# Patient Record
Sex: Male | Born: 1965 | Race: White | Hispanic: No | Marital: Married | Smoking: Former smoker
Health system: Southern US, Community
[De-identification: ages and names within clinical notes are randomized; demographics above are authoritative.]

## PROBLEM LIST (undated history)

## (undated) DIAGNOSIS — F419 Anxiety disorder, unspecified: Secondary | ICD-10-CM

## (undated) DIAGNOSIS — F329 Major depressive disorder, single episode, unspecified: Secondary | ICD-10-CM

## (undated) DIAGNOSIS — F32A Depression, unspecified: Secondary | ICD-10-CM

## (undated) DIAGNOSIS — G4730 Sleep apnea, unspecified: Secondary | ICD-10-CM

## (undated) HISTORY — PX: OTHER SURGICAL HISTORY: SHX169

## (undated) HISTORY — PX: VASECTOMY: SHX75

## (undated) HISTORY — PX: SHOULDER ARTHROSCOPY: SHX128

---

## 1997-05-30 ENCOUNTER — Encounter (HOSPITAL_COMMUNITY): Admission: RE | Admit: 1997-05-30 | Discharge: 1997-08-28 | Payer: Self-pay | Admitting: Psychiatry

## 1999-01-25 ENCOUNTER — Emergency Department (HOSPITAL_COMMUNITY): Admission: EM | Admit: 1999-01-25 | Discharge: 1999-01-25 | Payer: Self-pay | Admitting: Emergency Medicine

## 2000-08-30 ENCOUNTER — Ambulatory Visit (HOSPITAL_BASED_OUTPATIENT_CLINIC_OR_DEPARTMENT_OTHER): Admission: RE | Admit: 2000-08-30 | Discharge: 2000-08-30 | Payer: Self-pay | Admitting: Orthopedic Surgery

## 2003-01-05 ENCOUNTER — Emergency Department (HOSPITAL_COMMUNITY): Admission: EM | Admit: 2003-01-05 | Discharge: 2003-01-05 | Payer: Self-pay | Admitting: Emergency Medicine

## 2006-04-17 ENCOUNTER — Ambulatory Visit (HOSPITAL_COMMUNITY): Admission: RE | Admit: 2006-04-17 | Discharge: 2006-04-17 | Payer: Self-pay | Admitting: Family Medicine

## 2007-04-26 ENCOUNTER — Ambulatory Visit (HOSPITAL_COMMUNITY): Admission: RE | Admit: 2007-04-26 | Discharge: 2007-04-26 | Payer: Self-pay | Admitting: Family Medicine

## 2007-04-26 IMAGING — US US ABDOMEN COMPLETE
1 series · 14 of 25 positions shown · non-contrast
Comparison: none

CLINICAL DATA: 41-year-old male, elevated LFT(s), hepatomegaly by exam. 
ABDOMEN ULTRASOUND:
TECHNIQUE: Complete abdominal ultrasound examination was performed including evaluation of the liver, gallbladder, bile ducts, pancreas, kidneys, spleen, IVC, and abdominal aorta.

[Series 1: unknown · 0.33mm/px · 14 of 69 slices shown]
[im 1/69]
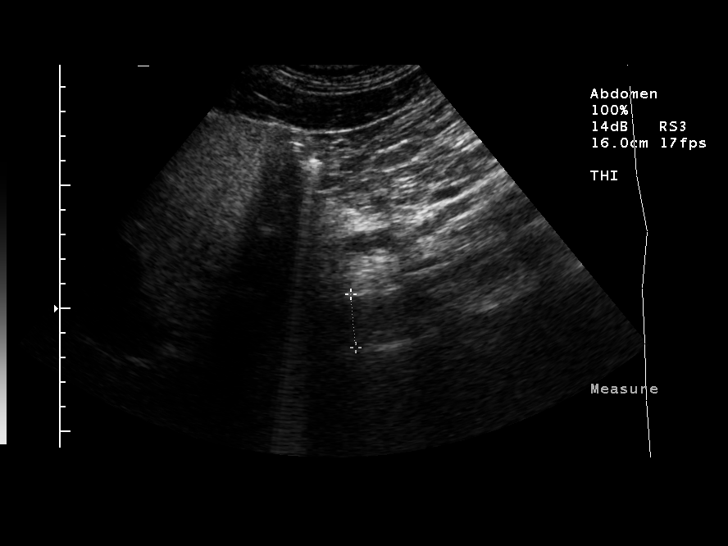
[im 6/69]
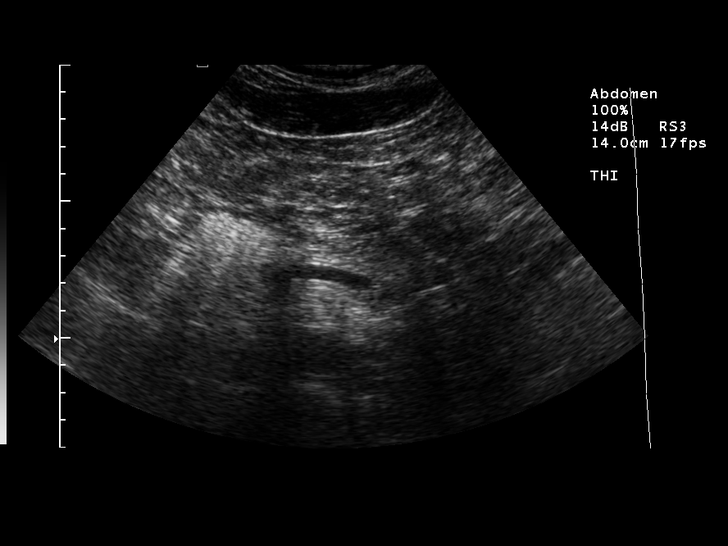
[im 12/69]
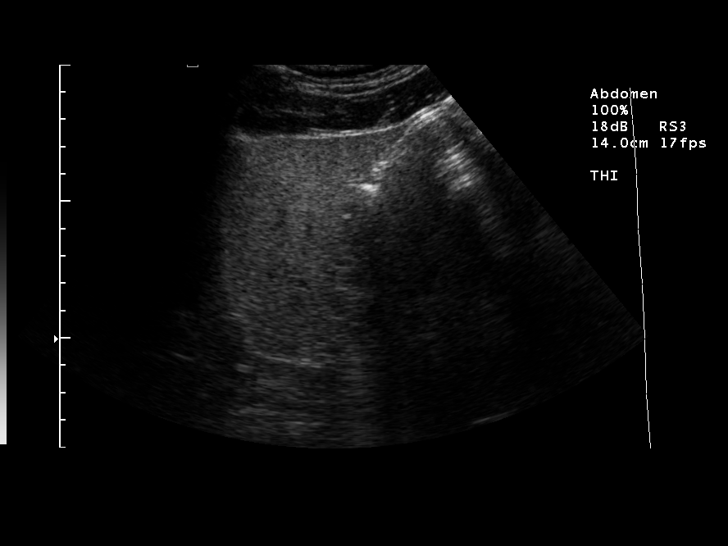
[im 18/69]
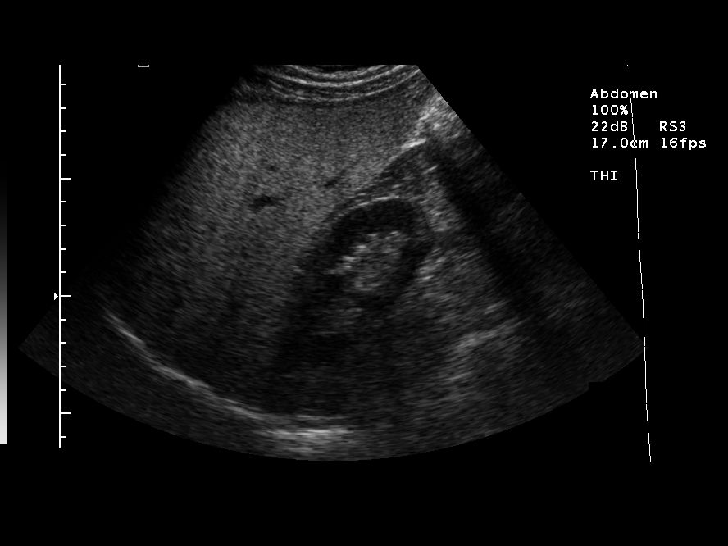
[im 23/69]
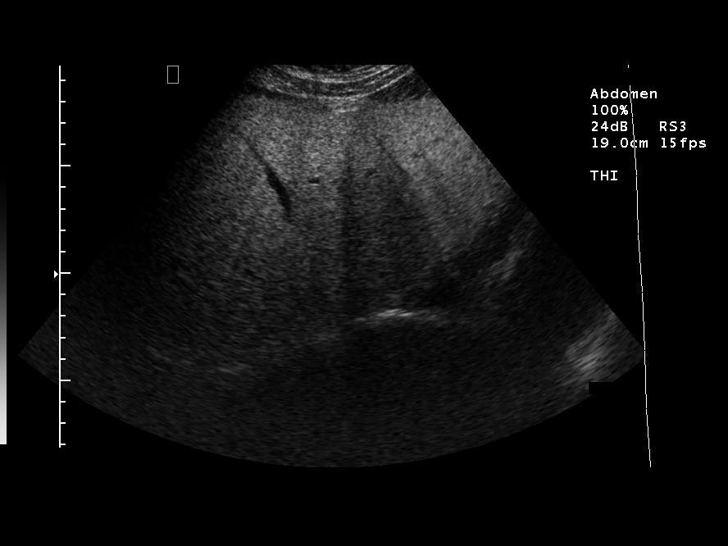
[im 26/69]
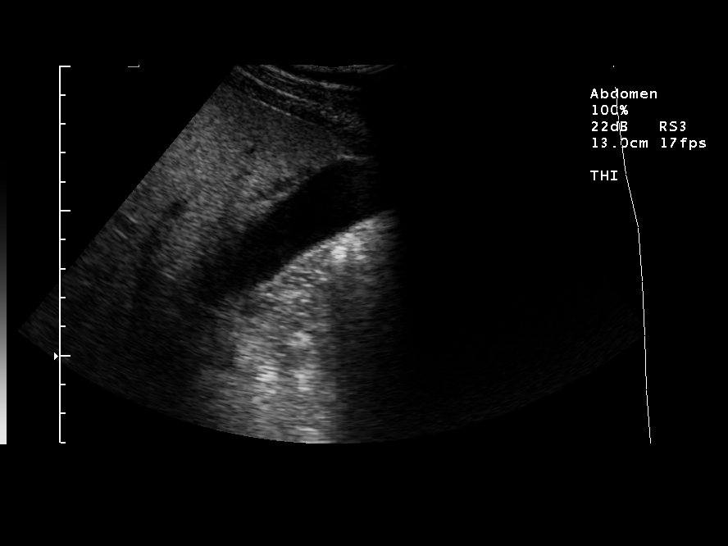
[im 32/69]
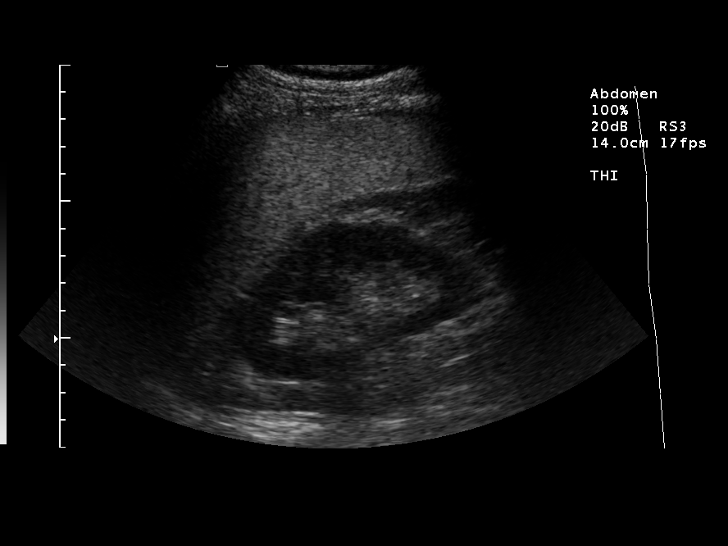
[im 37/69]
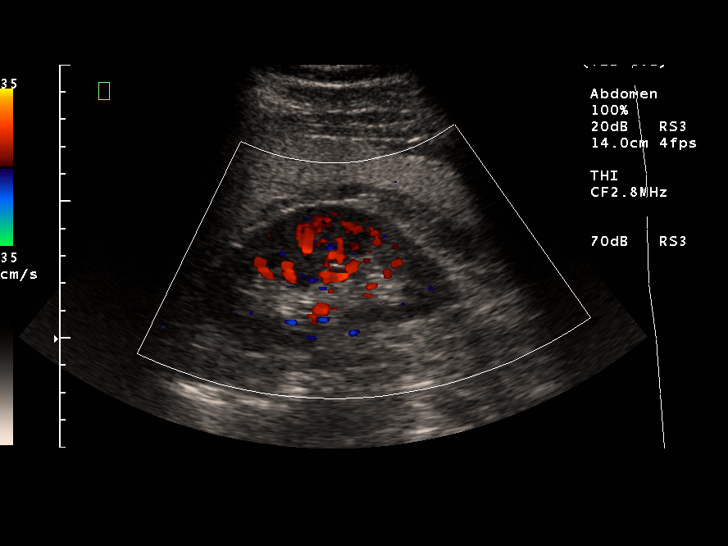
[im 43/69]
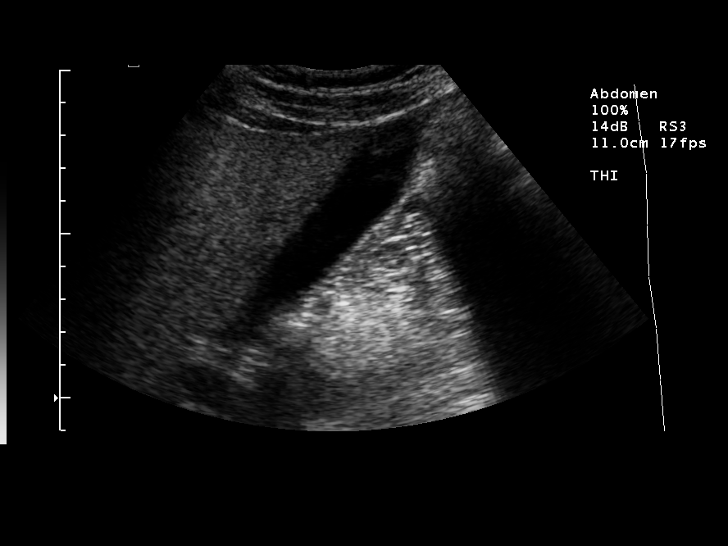
[im 46/69]
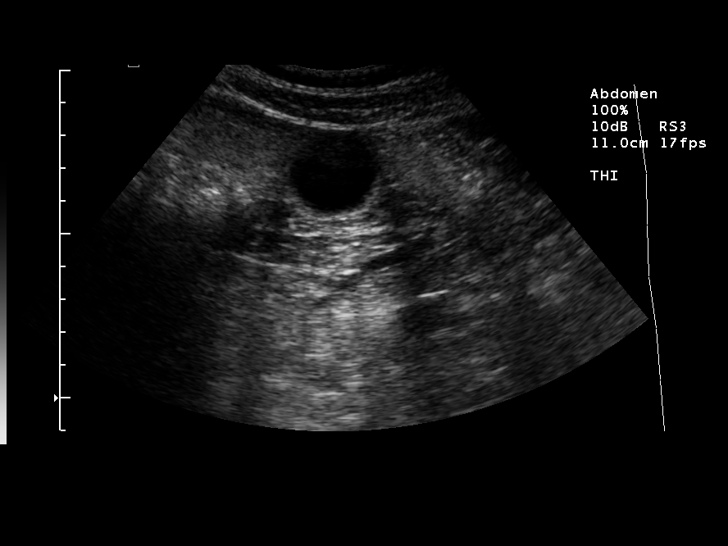
[im 52/69]
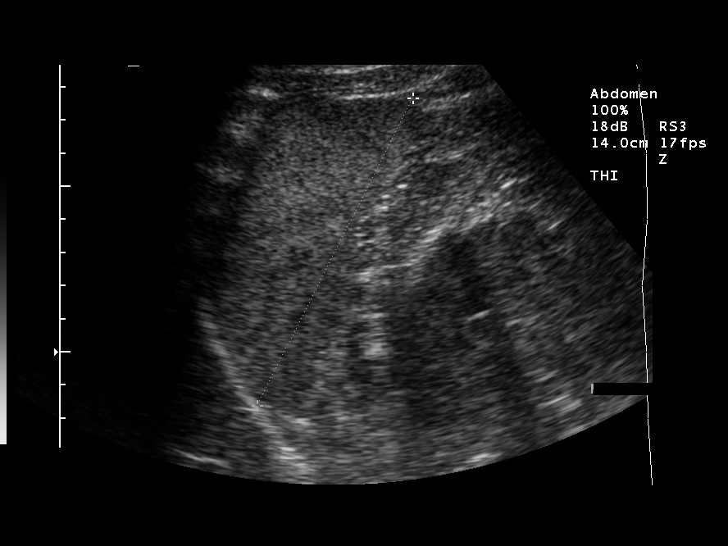
[im 57/69]
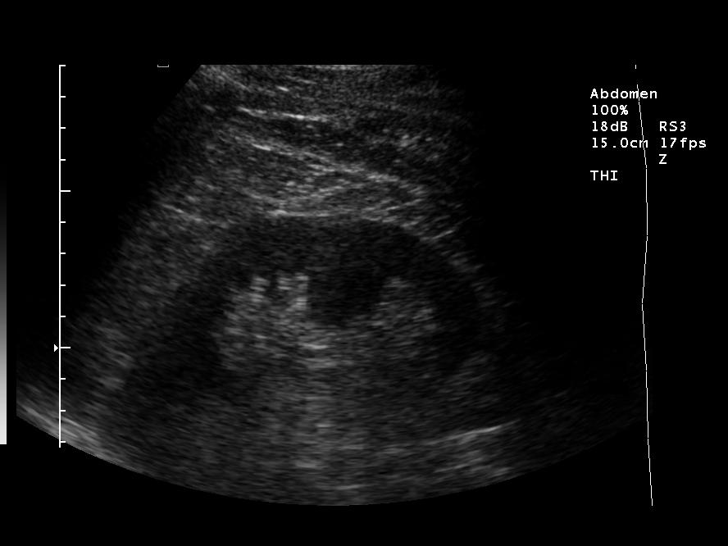
[im 63/69]
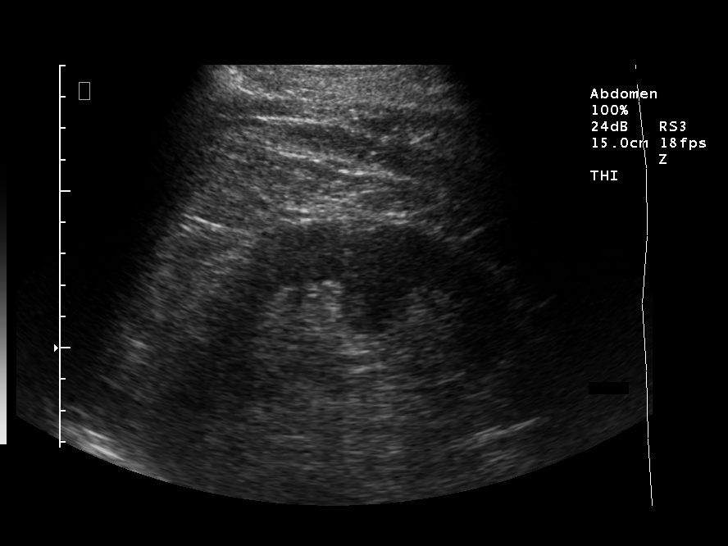
[im 69/69]
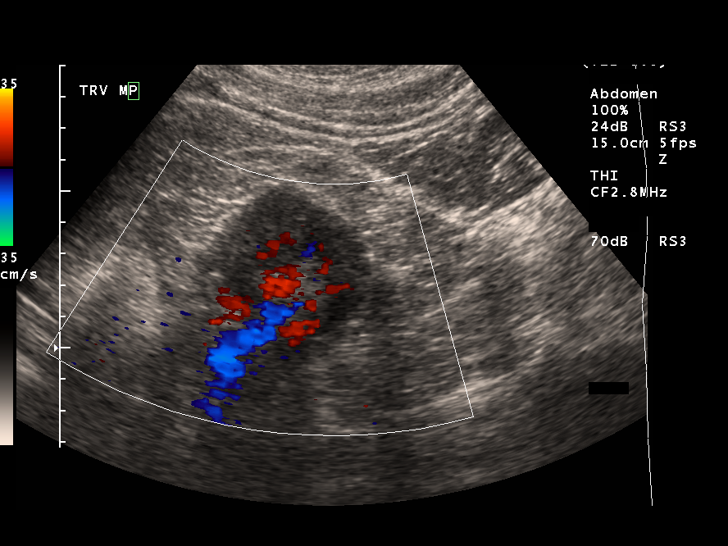

[14 of 25 positions shown; findings below may reference images not displayed]

FINDINGS: The gallbladder is normal with a wall thickness of 2 mm.  Negative for gallstones, pericholecystic fluid or a Murphy?s sign.  Common bile duct is normal measuring 4 mm.  The liver is diffusely echogenic consistent with fatty infiltration.  Longitudinal measurement of the liver is roughly 15.4 cm.  Imaging of the IVC, pancreas, spleen, kidneys and aorta is within normal limits.  The spleen measures 10.4 cm.  The right kidney measures 10.7 cm and the left kidney 11.3 cm.  No renal obstruction or hydronephrosis.  Aorta has a maximal diameter of 2.2 cm.  No aneurysm or ascites.
IMPRESSION: 1.  Increased echogenicity of the liver suspicious for fatty infiltration.  Liver craniocaudal length of 15.4 cm.
2.  No biliary dilatation.  
3.  No gallstones or gallbladder abnormality.

## 2007-05-03 ENCOUNTER — Ambulatory Visit: Payer: Self-pay | Admitting: Gastroenterology

## 2007-05-09 ENCOUNTER — Ambulatory Visit: Payer: Self-pay | Admitting: Gastroenterology

## 2007-07-05 ENCOUNTER — Ambulatory Visit: Payer: Self-pay | Admitting: Gastroenterology

## 2007-11-06 ENCOUNTER — Ambulatory Visit (HOSPITAL_BASED_OUTPATIENT_CLINIC_OR_DEPARTMENT_OTHER): Admission: RE | Admit: 2007-11-06 | Discharge: 2007-11-06 | Payer: Self-pay | Admitting: Orthopedic Surgery

## 2010-05-25 ENCOUNTER — Other Ambulatory Visit (HOSPITAL_COMMUNITY): Payer: Self-pay | Admitting: Family Medicine

## 2010-05-25 ENCOUNTER — Ambulatory Visit (HOSPITAL_COMMUNITY)
Admission: RE | Admit: 2010-05-25 | Discharge: 2010-05-25 | Disposition: A | Discharge status: Home / Self Care | Payer: BC Managed Care – PPO | Source: Ambulatory Visit | Attending: Family Medicine | Admitting: Family Medicine

## 2010-05-25 DIAGNOSIS — R109 Unspecified abdominal pain: Secondary | ICD-10-CM | POA: Insufficient documentation

## 2010-05-25 IMAGING — US US ABDOMEN COMPLETE
1 series · 13 of 25 positions shown · non-contrast
Comparison: 04/26/2007 study.

CLINICAL DATA: History of abdominal pain.

ABDOMINAL ULTRASOUND COMPLETE

[Series 1: us abdomen complete · 0.28mm/px · 13 of 99 slices shown]
[im 1/99]
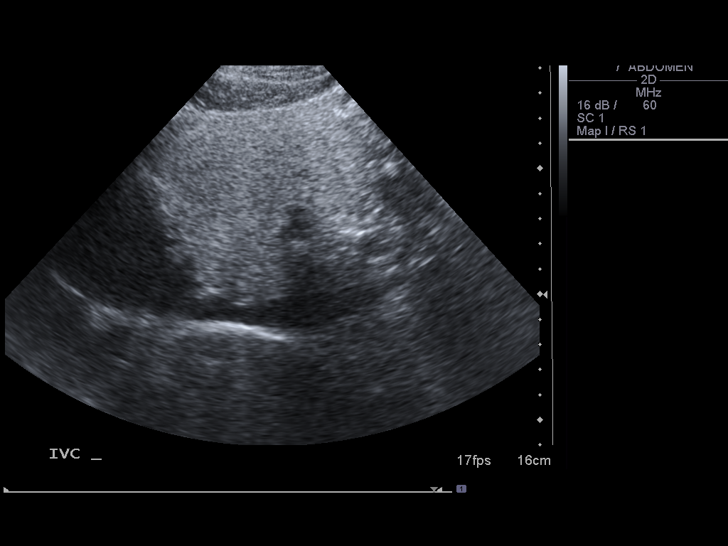
[im 9/99]
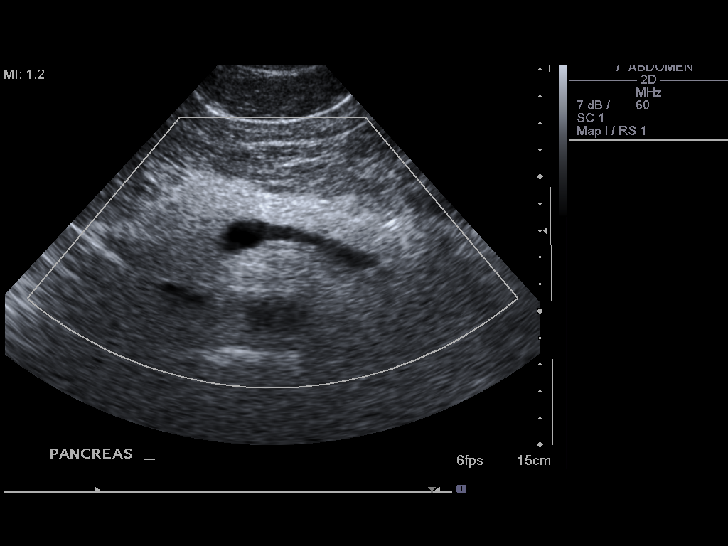
[im 17/99]
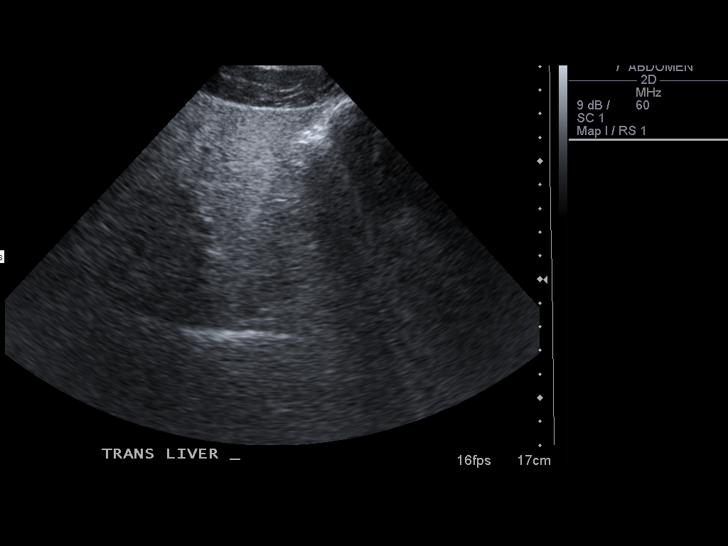
[im 25/99]
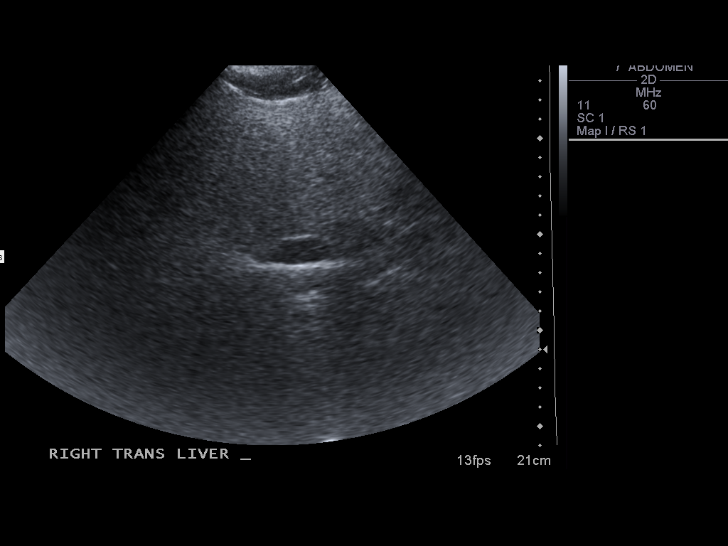
[im 33/99]
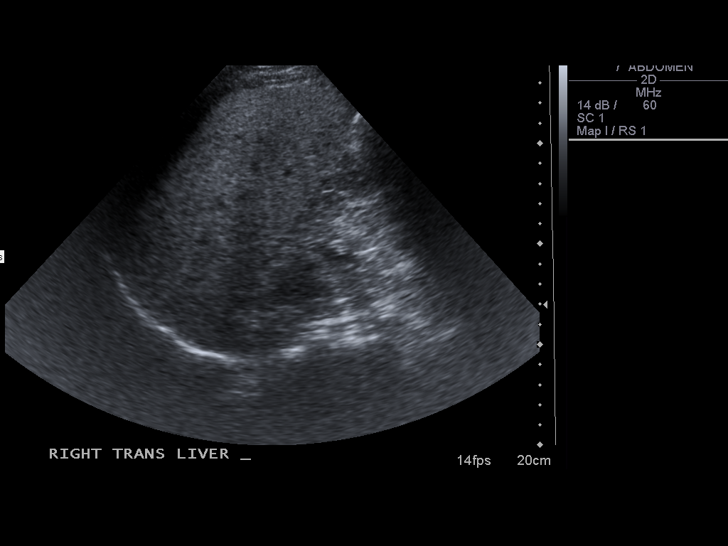
[im 41/99]
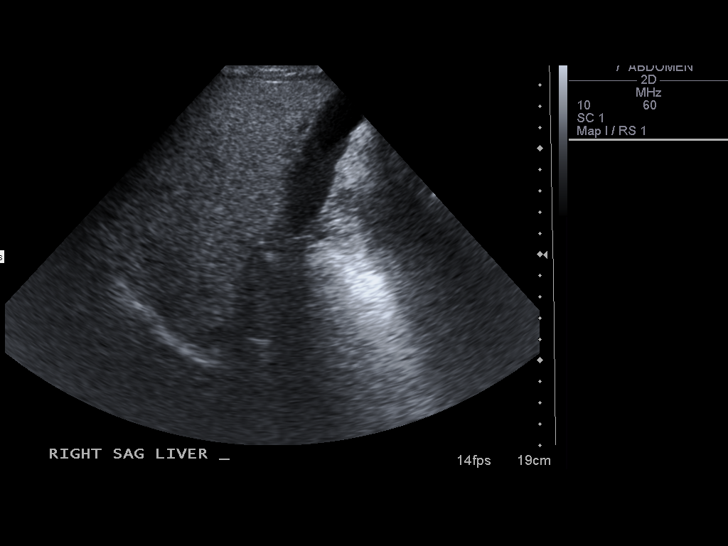
[im 50/99]
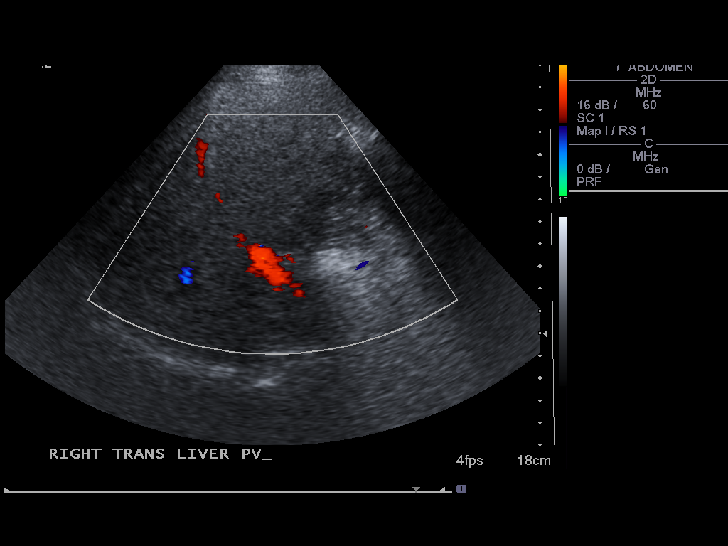
[im 58/99]
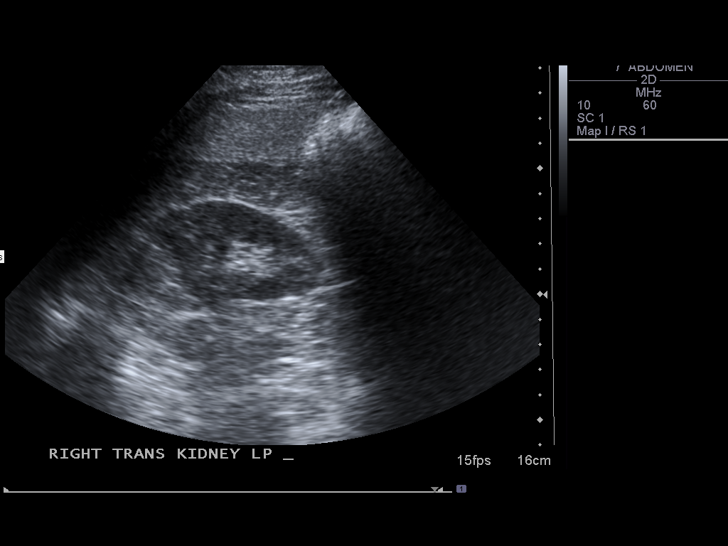
[im 66/99]
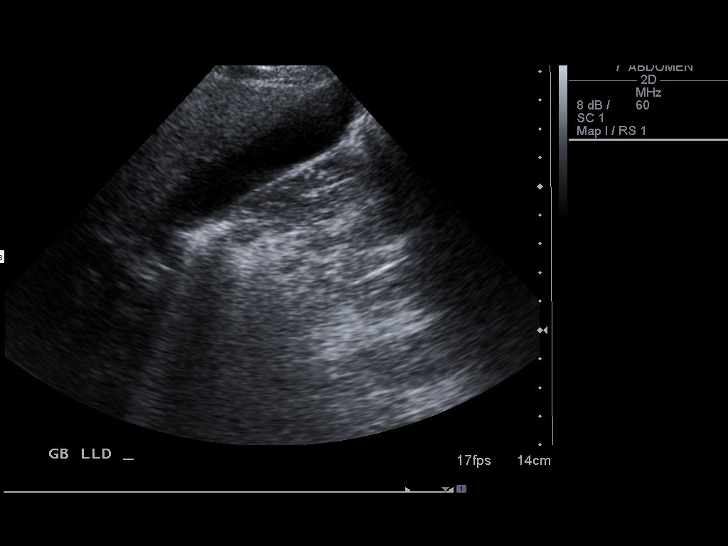
[im 74/99]
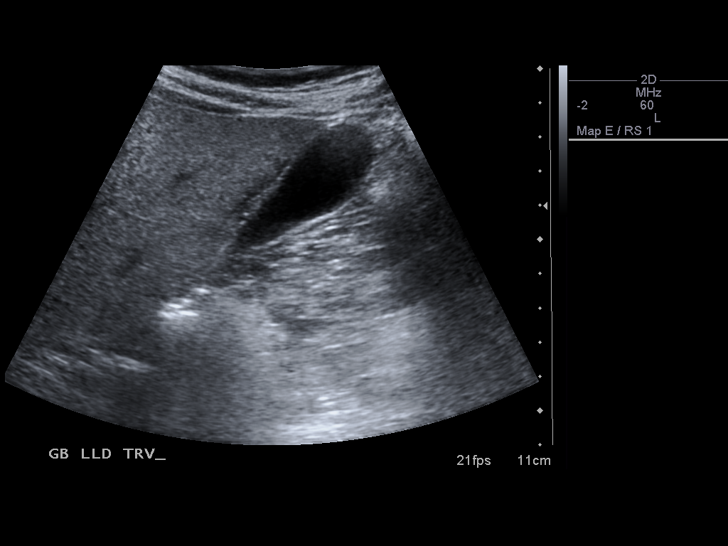
[im 82/99]
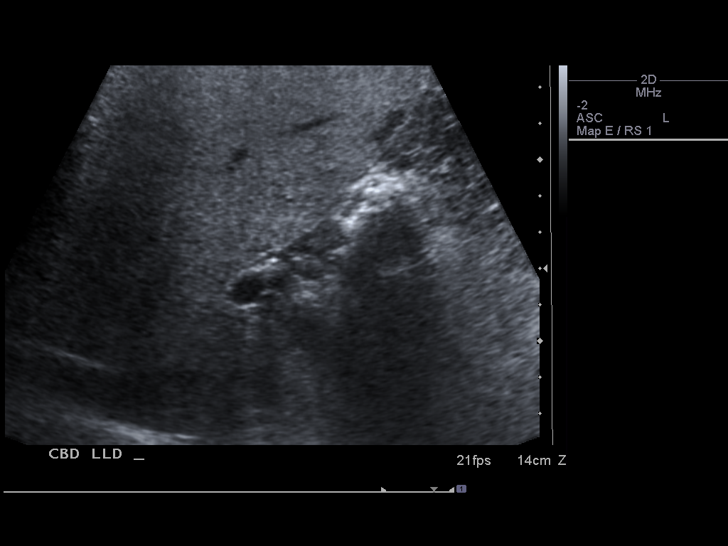
[im 90/99]
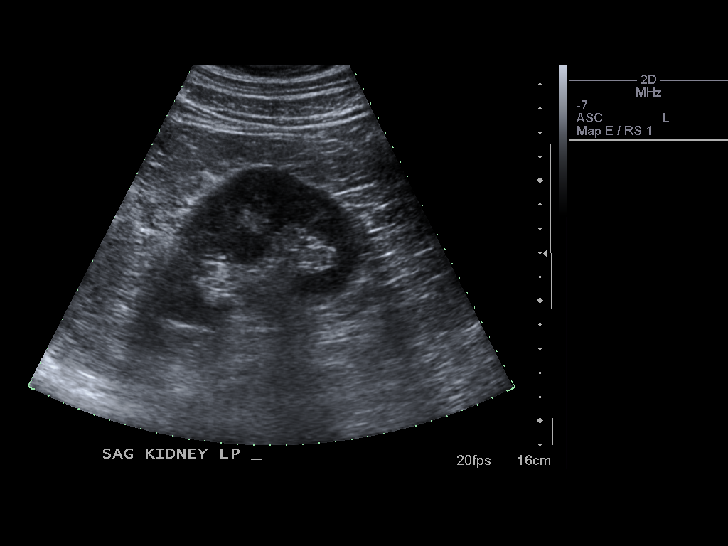
[im 99/99]
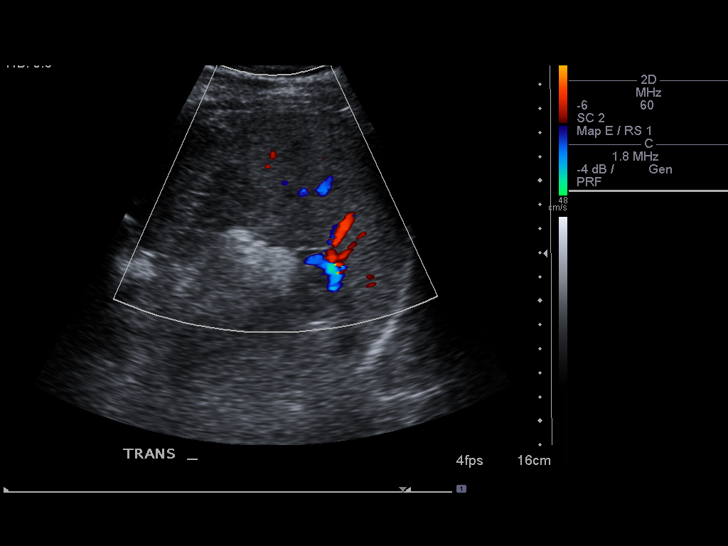

[13 of 25 positions shown; findings below may reference images not displayed]

FINDINGS: Gallbladder: No shadowing gallstones or echogenic sludge. No
gallbladder wall thickening or pericholecystic fluid. The
gallbladder wall thickness measured 0.7 mm. No sonographic Murphy's
sign according to the ultrasound technologist.

CBD: Normal in caliber measuring 1.6 mm. No choledocholithiasis is
evident.

Liver:  Normal size with increased echogenicity of parenchymal
echotexture without focal parenchymal abnormality. Most commonly
this is associated with fatty infiltration liver.  There appears to
be an area of fatty sparing near the gallbladder.

IVC:  Patent throughout its visualized course in the abdomen.

Pancreas:  Although the pancreas is difficult to visualize in its
entirety, no focal pancreatic abnormality is identified.

Spleen:  Normal size and echotexture without focal abnormality.
Length is 7.3 cm.

Right kidney:  No hydronephrosis.  Well-preserved cortex.  Normal
parenchymal echotexture without focal abnormalities.  Right renal
length is 9.6 cm.

Left kidney:  No hydronephrosis.  Well-preserved cortex.  Normal
parenchymal echotexture without focal abnormalities.  Left renal
length is 9.8 cm.

Aorta:  Maximum diameter is 1.8 cm.  No aneurysm is evident.

Ascites:  None.
IMPRESSION: Increased echogenicity of hepatic parenchyma is seen.
Most commonly this is associated with fatty infiltration of the
liver. There appears to be an area of fatty sparing near the
gallbladder.  Bile ducts normal caliber.  Normal appearance of the
gallbladder and kidneys.  No acute process evident.

## 2010-05-26 ENCOUNTER — Other Ambulatory Visit (HOSPITAL_COMMUNITY): Payer: Self-pay | Admitting: Family Medicine

## 2010-05-26 DIAGNOSIS — R1013 Epigastric pain: Secondary | ICD-10-CM

## 2010-05-27 ENCOUNTER — Other Ambulatory Visit (HOSPITAL_COMMUNITY): Payer: Self-pay

## 2010-05-27 ENCOUNTER — Encounter (HOSPITAL_COMMUNITY): Payer: Self-pay

## 2010-05-27 ENCOUNTER — Encounter (HOSPITAL_COMMUNITY)
Admission: RE | Admit: 2010-05-27 | Discharge: 2010-05-27 | Disposition: A | Discharge status: Home / Self Care | Payer: BC Managed Care – PPO | Source: Ambulatory Visit | Attending: Family Medicine | Admitting: Family Medicine

## 2010-05-27 DIAGNOSIS — R1013 Epigastric pain: Secondary | ICD-10-CM | POA: Insufficient documentation

## 2010-05-27 IMAGING — NM NM HEPATO W/GB/PHARM/[PERSON_NAME]
2 series · 12 of 12 positions shown · non-contrast
Comparison: Ultrasound 05/25/2010

CLINICAL DATA: Epigastric pain.

NUCLEAR MEDICINE HEPATOBILIARY IMAGING WITH GALLBLADDER EF
TECHNIQUE: Sequential images of the abdomen were obtained [DATE]
minutes following intravenous administration of
radiopharmaceutical.  After slow intravenous infusion of 0.3ucg
Cholecystokinin, gallbladder ejection fraction was determined.
Radiopharmaceutical:  S.SmMi 3c-EEm Choletec

[Series 1: hida · 3.20mm/px · 6 of 30 frames shown (1 of 2)]
[frame 3/30]
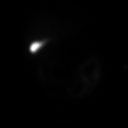
[frame 8/30]
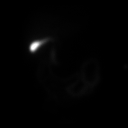
[frame 13/30]
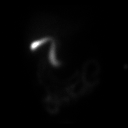
[frame 18/30]
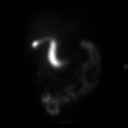
[frame 23/30]
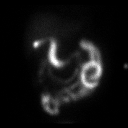
[frame 28/30]
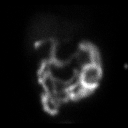

[Series 1: hida · 3.20mm/px · 6 of 60 frames shown (2 of 2)]
[frame 6/60]
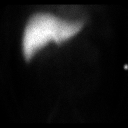
[frame 16/60]
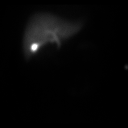
[frame 26/60]
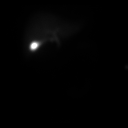
[frame 36/60]
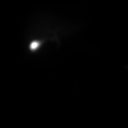
[frame 46/60]
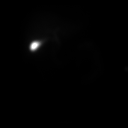
[frame 56/60]
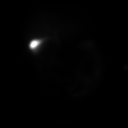

[12 of 12 positions shown; findings below may reference images not displayed]

FINDINGS: There is prompt uptake and excretion of radiotracer by
the liver.  No evidence of cystic duct or common duct obstruction.
Gallbladder ejection fraction is 98%.  Normal is greater than 30%.

The patient did not experience symptoms during CCK infusion.
IMPRESSION: Normal study.

## 2010-05-27 MED ORDER — TECHNETIUM TC 99M MEBROFENIN IV KIT
5.0000 | PACK | Freq: Once | INTRAVENOUS | Status: AC | PRN
  Administered 2010-05-27: 5.5 via INTRAVENOUS

## 2010-07-08 ENCOUNTER — Ambulatory Visit (INDEPENDENT_AMBULATORY_CARE_PROVIDER_SITE_OTHER): Payer: BC Managed Care – PPO | Admitting: Internal Medicine

## 2010-07-08 DIAGNOSIS — R1011 Right upper quadrant pain: Secondary | ICD-10-CM

## 2010-07-08 DIAGNOSIS — R1013 Epigastric pain: Secondary | ICD-10-CM

## 2010-07-12 ENCOUNTER — Ambulatory Visit (HOSPITAL_COMMUNITY)
Admission: RE | Admit: 2010-07-12 | Discharge: 2010-07-12 | Disposition: A | Discharge status: Home / Self Care | Payer: BC Managed Care – PPO | Source: Ambulatory Visit | Attending: Internal Medicine | Admitting: Internal Medicine

## 2010-07-12 ENCOUNTER — Encounter (HOSPITAL_BASED_OUTPATIENT_CLINIC_OR_DEPARTMENT_OTHER): Payer: BC Managed Care – PPO | Admitting: Internal Medicine

## 2010-07-12 DIAGNOSIS — I10 Essential (primary) hypertension: Secondary | ICD-10-CM | POA: Insufficient documentation

## 2010-07-12 DIAGNOSIS — K2100 Gastro-esophageal reflux disease with esophagitis, without bleeding: Secondary | ICD-10-CM | POA: Insufficient documentation

## 2010-07-12 DIAGNOSIS — R7401 Elevation of levels of liver transaminase levels: Secondary | ICD-10-CM | POA: Insufficient documentation

## 2010-07-12 DIAGNOSIS — R1013 Epigastric pain: Secondary | ICD-10-CM

## 2010-07-12 DIAGNOSIS — K208 Other esophagitis without bleeding: Secondary | ICD-10-CM

## 2010-07-12 DIAGNOSIS — K2940 Chronic atrophic gastritis without bleeding: Secondary | ICD-10-CM | POA: Insufficient documentation

## 2010-07-12 DIAGNOSIS — R7402 Elevation of levels of lactic acid dehydrogenase (LDH): Secondary | ICD-10-CM | POA: Insufficient documentation

## 2010-07-12 DIAGNOSIS — K2980 Duodenitis without bleeding: Secondary | ICD-10-CM | POA: Insufficient documentation

## 2010-07-12 DIAGNOSIS — R1011 Right upper quadrant pain: Secondary | ICD-10-CM

## 2010-07-12 DIAGNOSIS — K2970 Gastritis, unspecified, without bleeding: Secondary | ICD-10-CM

## 2010-07-12 LAB — ALT: ALT: 50 U/L (ref 0–53)

## 2010-07-12 LAB — HEPATITIS C ANTIBODY: HCV Ab: NEGATIVE

## 2010-07-12 LAB — HEPATITIS B SURFACE ANTIGEN: Hepatitis B Surface Ag: NEGATIVE

## 2010-07-12 LAB — AST: AST: 41 U/L — ABNORMAL HIGH (ref 0–37)

## 2010-07-12 LAB — FERRITIN: Ferritin: 252 ng/mL (ref 22–322)

## 2010-07-13 LAB — H. PYLORI ANTIBODY, IGG: H Pylori IgG: 4.55 {ISR} — ABNORMAL HIGH

## 2010-07-13 NOTE — Assessment & Plan Note (Signed)
NAME:  George [name], George [name]                CHART#:  [id number]   DATE:  05/09/2007                       DOB:  14-Dec-1965   CHIEF COMPLAINT:  Follow up alcohol abuse/transaminitis.   SUBJECTIVE:  The patient is a 45 year old male who was seen by me last  week on 05/03/2007.  Please refer to that initial consult note.  He has  been feeling poorly for the last month.  He was found to have  significant transaminitis and fatty liver.  He had significant alcohol  consumption felt to be related to his transaminitis.  He was urged to  abstain from alcohol, and he has actually quit drinking since I saw him  on 05/03/2007.  He was given Librium per withdrawal protocol.  He is  currently taking 25 mg t.i.d. and will change to b.i.d. tomorrow.  He  has noted significant irritability, denies any suicidal or homicidal  ideation.  He has been going out and playing some golf, but has not been  able to work as he just does not feel himself.  He also has been having  some chest pain for which he has been seen by Dr. Allyson Sabal of cardiology.  He is scheduled to have a stress test and sleep study.   CURRENT MEDICATIONS:  See updated list from 05/09/2007.   ALLERGIES:  RELAFIN.   OBJECTIVE:  VITAL SIGNS:  Weight 185 pounds, height 69 inches, temp  97.7, blood pressure 114/80, and pulse 84.  GENERAL:  He is a well-developed, well-nourished Caucasian male in no  acute distress.HEENT:  Sclerae clear, non injected.  Conjunctivae pink.  Oropharynx pink and moist without any lesions.CHEST:  Heart regular rate  and rhythm.  Normal S1, S2.  ABDOMEN:  Positive bowel sounds x4.  No bruits auscultated, soft,  nontender, nondistended without palpable mass or hepatosplenomegaly.  No  rebound, tenderness or guarding.EXTREMITIES:  Without clubbing or edema  bilaterally.  SKIN:  Pink and warm without any rash or jaundice.   LABORATORY STUDIES:  From 05/07/2007:  He had a normal iron 164, UIBC  122, TIBC 286, percent  saturation 57.  He had a total bilirubin of 0.7,  direct of 0.1, indirect of 0.6.  Alkaline phosphatase is 64, AST 99 down  from 149, ALT 182 down from 233, total protein 8, albumin 4.6 and  ferritin 1183.   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  He has discontinued drinking 6  days ago.  Transaminitis is gradually improving.  I suspect ferritin is  acute phase reactant.  He is doing well on Librium withdrawal.  He is  having some irritability which I would like him to follow up with Dr.  Phillips Odor as he may need long-term treatment.   PLAN:  1. He is to make an appointment with Dr. Phillips Odor to be seen regarding      his irritability and complete Librium protocol.  2. He was commended on his alcohol cessation.  3. Liver function tests in 1 month with office visit with Dr. Cira Servant to      follow.   ADDENDUM [id number]:  Patient could use a mental health referral.       Lorenza Burton, N.P.  Electronically Signed     Kassie Mends, M.D.  Electronically Signed  KJ/MEDQ  D:  05/09/2007  T:  05/09/2007  Job:  [id number]   cc:   Corrie Mckusick, M.D.

## 2010-07-13 NOTE — Assessment & Plan Note (Signed)
NAMEMarland Kitchen  AMYR, SLUDER                CHART#:  [id number]   DATE:  07/05/2007                       DOB:  1965/12/12   REFERRING PHYSICIAN:  Corrie Mckusick, M.D.   PROBLEM LIST:  1. Alcoholic hepatitis.  2. Alcohol abuse.   SUBJECTIVE:  Mr. Almond is a 45 year old male who was initially seen in  March of 2009 for elevated liver enzymes.  He was noted to consume at  least 8 beers a day for the last 10 years.  His initial hepatic panel  showed an ALT of 232 with an AST of 149.  He had an iron panel which  showed a ferritin of 1183, but a percent saturation of 57.  He was  placed on a Librium taper and has discontinued the use of alcohol.  He  has no particular questions or concerns.  He is complaining of  occasional dull headaches 1/10, on and off for the last 2-3 weeks.  He  is not going to AA or seeing a mental health specialist.  He has lost 13  pounds because he is now walking during his golf games instead of riding  a cart.  He is no longer feeling dizzy.  He works as a Chartered certified accountant.   MEDICATIONS:  1. Multivitamin.  2. Thiamine.  3. Wellbutrin 100 mg b.i.d.  4. Xanax 0.5 mg b.i.d.   OBJECTIVE:   PHYSICAL EXAMINATION:  VITAL SIGNS:  Weight 168 pounds, height 5 feet 9  inches, temperature 98.4, blood pressure 122/74, and pulse 84.  GENERAL:  He is in no apparent distress, alert and oriented x4. LUNGS:  Clear to auscultation bilaterally. CARDIOVASCULAR:  Regular rhythm with  no murmurs.  ABDOMEN:  Bowel sounds are present.  Soft, nontender, and nondistended.   LABORATORY DATA:  On Jul 02, 2007; total bilirubin 0.7, alkaline  phosphatase 53, AST 36, ALT 45, albumin 4.7.   ASSESSMENT:  Mr. Vandermeulen is a 45 year old male who had alcoholic  hepatitis and his liver enzymes are now normal after cessation of  alcohol.  Ferritin can be elevated in alcoholic liver disease due to  impaired fatty oxidation.  Thank you for allowing me to see Mr. Mavis  in consultation.  My recommendations  follow.   RECOMMENDATIONS:  1. He may discontinue the use of thiamine.  2. Check a fasting ferritin in 3 months.  3. He may follow up with [place] as needed.       Kassie Mends, M.D.  Electronically Signed     SM/MEDQ  D:  07/06/2007  T:  07/06/2007  Job:  [id number]   cc:   Corrie Mckusick, M.D.

## 2010-07-13 NOTE — Assessment & Plan Note (Signed)
NAMEMarland Kitchen  MACIAH, SCHWEIGERT                CHART#:  [id number]   DATE:  05/03/2007                       DOB:  02/13/1966   REQUESTING PHYSICIAN:  Dr. Phillips Odor.   CHIEF COMPLAINT:  Transaminitis.   HISTORY OF PRESENT ILLNESS:  Mr. Stanke is a 45 year old Caucasian male.  He is referred today for transaminitis.  He tells me about 1 year ago he  had similar findings of elevated LFTs, more recently he was seen by his  primary care physician as he was having fatigue and overall feeling  quite bad about 3 weeks ago.  On 04/16/2007, he had an elevated AST of  149 and ALT of 233.  He had otherwise normal LFTs.  He had a GGT of 143.  He had negative hepatitis A, B and C acute panels.  He had an abdominal  ultrasound which showed increased echogenicity in the liver suspicious  for fatty infiltration in the liver, length of 15.4 cm.  There was no  biliary dilatation, gallstones or other abnormalities.  He admits to  consuming at least 8 beers a day for the last 10 years.  He occasionally  has shots of liquor in between.  On weekends, he can consume almost  double 5th period.  He denies any history of  diabetes mellitus. He  denies any nausea, vomiting or abdominal pain.  He denies any jaundice  or darkening of his urine.  He denies any fever or chills.  He has never  had a blood transfusion.  He does have 1 tattoo on his right arm.  He  denies any Tylenol or drug use.  He did attempt to receive alcohol abuse  treatment back in 1999 when his daughters were born.  He gives no  history of elevated cholesterol, however, he was found to have a total  cholesterol of 269 and LDL of 176.   PAST MEDICAL/SURGICAL HISTORY:  He has had right wrist surgery and  alcohol abuse.   CURRENT MEDICATIONS:  He denies any allergies.  RELAFEN causes a rash.   FAMILY HISTORY:  There is no known family history of recorded of  carcinoma or lower chronic GI problems.  Mother age 76 is healthy.  Father at 28 is healthy.  He  has 2 healthy brothers.   SOCIAL HISTORY:  Mr. Roosevelt is married, has a set of twin daughters who  are 10.  He is a Chartered certified accountant.  He has a 20 pack year history of tobacco  abuse, quit 3 years ago.  He denies any drug use.   REVIEW OF SYSTEMS:  See HPI.  Otherwise negative.   PHYSICAL EXAMINATION:  VITAL SIGNS:  Weight 181.5 pounds, height 5 feet  9 inches, temperature 98.5, blood pressure 102/70, pulse 80.  GENERAL:  Mr. Vallery is a well developed, well nourished Caucasian male  in no acute distress.HEENT:  Sclerae clear, conjunctivae pink,  oropharynx pink and moist without any lesions. NECK:  Supple without  mass or thyromegaly.  CHEST:  Regular rate and rhythm, normal S1, S2  without murmurs, clicks, rubs or gallops.LUNGS: Clear to auscultation  bilaterally.  ABDOMEN:  Positive bowel sounds x4, no bruits auscultated.  Liver  palpable just below the right costal margin.  Unable to palpate  splenomegaly.  There is no rebound tenderness or guarding.  EXTREMITIES:  Without  clubbing or edema bilaterally.  SKIN:  Pink, warm and dry without any rash or jaundice.   LABORATORY STUDIES:  From 04/16/2007 show a white blood cell count of  5.9, hemoglobin 16.4, hematocrit 46.7, platelet count 204. He had a  normal CMP except for elevated AST and ALT as described above and a  total protein up to 80.4 and glucose of 145.  His total cholesterol is  269.  Triglycerides 148, and LDL of 176, HDL of 63, TSH was 1.702 which  is normal.  He had a hemoglobin A1C of 5.8 and negative HIV.   IMPRESSION:  Mr. Mccutchan is a 45 year old male with elevated AST and ALT  and changes of fatty liver on ultrasound with extensive history of  alcohol abuse.  I suspect that we are dealing with alcoholic hepatitis.  At this point, there was no evidence of cirrhosis on ultrasound, and  there is no evidence of hypoalbuminemia nor thrombocytopenia suggesting  cirrhosis.  Hopefully, if he discontinues alcohol we will see  his  transaminases return to normal.  We cannot rule out comorbid condition  such as hemochromatosis or non-alcoholic fatty liver disease as well.  There is no evidence of viral hepatitis.   PLAN:  1. We had a lengthy discussion approximately 30 minutes about alcohol      sensation.  2. He is going to try Librium 50 mg q 6 hours x 72 hours, then 25 mg q      6 hours x72 hours then 25 mg b.i.d. x72 hours.  3. He is instructed to consume no alcohol whatsoever and discontinue      Librium if he begins drinking again as well as call me.  He agrees      with this plan.  4. Thiamin 100 mg daily for 2 weeks, no refills.  5. Begin a multivitamin daily.  6. Will recheck LFTs, iron and ferritin prior to office visit next      week.       Lorenza Burton, N.P.  Electronically Signed     Kassie Mends, M.D.  Electronically Signed    KJ/MEDQ  D:  05/03/2007  T:  05/03/2007  Job:  [id number]   cc:   Corrie Mckusick, M.D.

## 2010-07-13 NOTE — Op Note (Signed)
NAME:  George [name]               ACCOUNT NO.:  [id number]   MEDICAL RECORD NO.:  [id number]          PATIENT TYPE:  AMB   LOCATION:  [facility]SC                          FACILITY:  [facility]   PHYSICIAN:  Katy Fitch. Sypher, M.D. 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   OPERATION:  1. Examination of right shoulder under anesthesia.  2. Arthroscopic examination of right glenohumeral joint with      debridement of anterior superior and anterior posterior labrum and      debridement of degenerative labral anchor.  3. Subacromial decompression with bursectomy, coracoacromial ligament      relaxation, and acromioplasty.  4. Arthroscopic distal clavicle resection.   OPERATING SURGEON:  Katy Fitch. Sypher, MD   ASSISTANT:  Marveen Reeks Dasnoit, PA-C   ANESTHESIA:  General endotracheal supplemented by a right interscalene  block.   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  He  had developed chronic pain in the shoulder after repetitive lifting on  the job.  Examination of his shoulder revealed signs of the degenerative  AC joint aggravated  by repetitive lifting and an impingement sign.  Plain x-rays documented degenerative change of AC joint and signs of  rotator cuff stress.  An MRI of the shoulder demonstrated tendinopathy  of the rotator cuff and a very unfavorable anterior acromial anatomy at  the [facility] joint and insertion of the coracoacromial ligament.   Due to failure to respond to more than 6 months of nonoperative therapy,  he is brought to the operating at this time anticipating anterior  decompression, distal clavicle resection, and attention to the rotator  cuff as our findings dictated at the time of surgery.   Preoperatively, he was advised of the potential risks and benefits of  the surgery.  Questions were invited and answered in detail.   PROCEDURE:  George [name] was brought to the operating room and placed  in supine position on the operating table.   Following the placement of an interscalene block by Dr. Noreene Larsson in the  holding area, excellent anesthesia of the right forequarter was  achieved.   George [name] was brought to the operating room, placed in supine position  on the table, and under Dr. Morley Kos direct supervision, general  anesthesia by endotracheal technique induced.   He  was carefully positioned in the beach-chair position with aid of a  torso and head holder designed for shoulder arthroscopy.  The right  upper extremity and forequarter prepped with DuraPrep and draped with  impervious arthroscopy drapes.  One gram of Ancef had been administered  as an IV prophylactic antibiotic in the holding area.   Procedure commenced with examination of shoulder under anesthesia.  Shoulder was fundamentally stable.  The scope was then introduced  through standard posterior viewing portal.  Diagnostic arthroscopy  revealed intact hyaline articular cartilage surfaces on the glenoid and  humeral head.  The labrum was mildly undermined with some degenerative  frayed margin.  A nerve hook was introduced  through a standard anterior  portal and the labral attachment carefully palpated.  There was a bit of  undermining deep to the labrum; however, there was a negative peel-back  test and no frank instability of the biceps origin.   Given the circumstance, a 4.5-mm suction shaver was used through the  anterior portal to thoroughly debride the degenerative portion of the  labral attachment and to partially decorticate the superior labrum to  allow scarring to occur to stabilize the labrum.   The anterior supraspinatus was noted to be degenerative adjacent to the  rotator interval.  A small flap at this level was debrided with a  suction shaver.  The tendon was palpated with an 18-gauge needle and  found to be fundamentally sound.   The remainder of the examination of the joint was unremarkable with an  intact subscapularis insertion, intact articular surface insertion of  the supraspinatus, infraspinatus, and teres minor.   The scope was removed from the glenohumeral joint and placed in  subacromial space.  A florid bursitis was noted.  This was debrided with  a suction shaver followed by relaxation of the coracoacromial ligament  with the cutting cautery.  The acromion was leveled to a type 1  morphology with a suction shaver with visualization both posteriorly and  laterally.  The capsule of the [facility] joint was inspected and found to be  partially violated.  This was subsequently removed with the  electrocautery and the distal 15 mm of clavicle removed arthroscopically  with a suction bur.  A final image of the decompression was obtained  with a digital camera.  Hemostasis was achieved with bipolar cautery.  After lavage of the subacromial space, the arthroscopic equipment was  removed and the portals repaired with mattress suture of 3-0 Prolene.   Our final diagnosis was a partial type 1 SLAP lesion that was debrided  and the superior labrum slightly decorticated to facilitate healing.   Also, there was a small anterior tear of the supraspinatus due to  chronic stage II impingement.  There was no evidence of retracted  rotator cuff tear or instability.  George [name] was placed in a sling and  transferred to the recovery room with stable vital signs.   We anticipate discharge home to care of his wife.  He will return to our  office in 24 hours for dressing change, x-ray, and initiation of range  of motion therapy program.      Katy Fitch. Sypher, M.D.  Electronically Signed     RVS/MEDQ  D:  11/06/2007  T:  11/07/2007  Job:  [id number]   cc:   Corrie Mckusick, M.D.

## 2010-07-16 NOTE — Op Note (Signed)
[place]  Patient:    [name], George                      MRN: [id number] Proc. Date: 08/30/00 Adm. Date:  [id number] Attending:  Marlowe Shores                           Operative Report  PREOPERATIVE DIAGNOSES:  Retained hardware, right wrist.  POSTOPERATIVE DIAGNOSES:  Retained hardware, right wrist.  PROCEDURE:  Removal of hardware, right wrist x 2.  SURGEON:  Artist Pais. Mina Marble, M.D.  ASSISTANT:  None.  ANESTHESIA:  General.  TOURNIQUET TIME:  28 minutes.  COMPLICATIONS:  None.  DRAINS:  None.  OPERATIVE REPORT:  Patient taken to the operating room where after induction of adequate general anesthesia right upper extremity was prepped and draped in the usual sterile fashion.  An Esmarch was used to exsanguinate the limb. Tourniquet was inflated to 250 mmHg.  At this point in time a 2.5-3 cm incision was made longitudinally over a previous scar.  The incision was taken down through the skin and subcutaneous tissues carefully identifying retractor, large branch, superficial radial nerves, and cephalic vein.  Once this was done dissection was carried down to an area just distal to the radial styloid where the pins were encountered.  Using intraoperative x-rays the pins were identified and removed without complication.  The wound was then thoroughly irrigated.  Intraoperative x-rays showed good alignment of the carpus with no evidence of DCVISI, or BCVISI deformity.  The wound was then irrigated and closed with running 3-0 Prolene subcuticular stitch. Steri-Strips, 4 x 4s Plus, compressive dressing were applied.  Patient tolerated procedure well and went to recovery in stable fashion. DD:  08/30/00 TD:  08/30/00 Job: [id number]

## 2010-08-01 NOTE — Op Note (Signed)
  NAME:  George [name], George [name]               ACCOUNT NO.:  [id number]  MEDICAL RECORD NO.:  [id number]           PATIENT TYPE:  O  LOCATION:  DAYP                          FACILITY:  [facility]  PHYSICIAN:  Lionel December, M.D.    DATE OF BIRTH:  12/15/1965  DATE OF PROCEDURE:  07/12/2010 DATE OF DISCHARGE:                              OPERATIVE REPORT   PROCEDURE:  Esophagogastroduodenoscopy.  INDICATION:  [name] is 45 year old Caucasian male with intermittent pain in the epigastric region, right upper quadrant for the last 3 months, made worse with meals.  He has elevated transaminases, felt to be due to fatty liver.  He had ultrasound which showed echogenic liver, but negative for gallstones.  HIDA scan revealed EF of over 90% and symptoms are not reproduced.  He is undergoing diagnostic EGD looking for peptic ulcer disease which might explain his symptoms.  Procedures were reviewed with the patient.  Informed consent was obtained.  MEDS FOR CONSCIOUS SEDATION:  Cetacaine spray for pharyngeal topical anesthesia, Demerol 50 mg IV, Versed 8 mg IV.  FINDINGS:  Procedure performed in endoscopy suite.  The patient's vital signs and O2 sat were monitored during the procedure and remained stable.  The patient was placed in left lateral recumbent position and Pentax videoscope was passed through oropharynx without any difficulty into esophagus.  Esophagus.  Mucosa of the proximal middle third was normal.  There were 3 small erosions within 2 cm of GE junction.  GE junction was located at 39 cm from the incisors.  Stomach.  It was empty and distended very well by insufflation.  Folds of proximal stomach are normal.  Examination of mucosa at body was normal.  There are few antral erosions.  There was scar at angularis, surrounding mucosa was erythematous.  Pictures were taken for the record.  Fundus and cardia were normal.  Pyloric channel was patent.  Duodenum.  There was a patchy edema,  erythema, single erosion, and bulb and no ulcer crater was noted.  The scope was passed to second part of duodenal mucosa and folds were normal.  Endoscope was withdrawn.  The patient tolerated the procedure well.  FINAL DIAGNOSES: 1. Erosive reflux esophagitis. 2. Erosive antral gastritis and bulbar duodenitis with a scar at     angularis.  RECOMMENDATIONS: 1. Antireflux measures. 2. Pantoprazole 40 mg p.o. q.a.m. prescription given for 30 with 5     refills. 3. The patient advised not to take any NSAIDs.  We will check his H. pylori serology today and repeat his SGOT and SGPT also obtained.  Blood for hepatitis B surface antigen, hepatitis C virus antibody, and serum ferritin.          ______________________________ Lionel December, M.D.     NR/MEDQ  D:  07/12/2010  T:  07/12/2010  Job:  [id number]  cc:   [facility] Dr. Irving Shows  Electronically Signed by Lionel December M.D. on 08/01/2010 12:16:31 PM

## 2010-08-01 NOTE — Consult Note (Signed)
NAME:  George [name], George [name]               ACCOUNT NO.:  [id number]  MEDICAL RECORD NO.:  [id number]           PATIENT TYPE:  LOCATION:                                 FACILITY:  PHYSICIAN:  Lionel December, M.D.    DATE OF BIRTH:  1965-11-13  DATE OF CONSULTATION: DATE OF DISCHARGE:                                CONSULTATION   REASON FOR CONSULTATION:  Right upper quadrant pain and epigastric pain.  HISTORY OF PRESENT ILLNESS:  George [name] is a 45 year old white male presenting today as a referral from [facility] for right upper quadrant pain and epigastric pain.  He states the pain does radiate into his back at times.  He also complains of chest pressure which has resolved recently.  He has had this pain for 2 months.  He describes the pain as a dull ache.  He also gives a history of falling on the right side of his chest 2 weeks ago and it was a hard fall.  His right upper quadrant pain was before the fall.  He did undergo an ultrasound of the abdomen on May 25, 2010, which revealed increased echogenicity of hepatic parenchyma.  This is associated with fatty infiltration of the liver.  There appears to be an area of fatty sparing near the gallbladder.  The bile ducts were normal.  Normal appearance of the gallbladder and kidneys.  No acute process is evident.  The common bile duct measures 1.6 mm.  There was no choledocholithiasis.  George [name] also underwent a HIDA scan on May 27, 2010, which revealed a normal study. His gallbladder ejection fraction was 98%.  Labs on May 24, 2010, it was noted his AST was elevated at 66, ALT 98, total protein 8.4, his BUN was elevated at 25.  His total bilirubin was 0.9.  George [name].  His appetite is good.  There has been no weight loss.  He does state the pain usually worsens after he eats.  He also complains of burning in his esophagus, but this is better.  He was started on Prilosec over-the-counter and the burning  improved.  ALLERGIES:  He is allergic to RELAFEN which causes a rash.  SURGERY:  He had a right shoulder arthroscopy for tear.  He has had a right wrist injury repair.  He denies any past medical problems.  FAMILY HISTORY:  His mother is alive with a recent history of breast cancer.  Father is alive in good health.  Two brothers in good health. He is married.  He is employed at [employer].  He does not smoke. He drinks about 4-5 beers a [name].  No drugs and he has 2 children in good health.  MEDICATIONS:  The only medication he is on is Lexapro 10 mg p.o. daily.  OBJECTIVE:  VITAL SIGNS:  His weight is 186, his height is 5 feet 8 inches, temperature is 97.3.  His blood pressure is 136/100, his pulse is 80. HEENT:  He has natural teeth.  His oral mucosa is moist.  His conjunctivae are pink.  His sclerae anicteric. NECK:  His  thyroid is normal.  There is no cervical lymphadenopathy. CHEST:  His lungs are clear. HEART:  Regular rate and rhythm. ABDOMEN:  Soft.  Bowel sounds are positive.  No masses.  There is no tenderness today to his abdomen.  ASSESSMENT:  George [name] is a 45 year old male presenting today with a right upper quadrant pain so far he has had negative workup except his AST and ALT are slightly elevated.  This could be from his EtOH intake 4-5 beers a [name].  He does appear to have a fatty liver.  His pain however does worsen after he eats a meal.  Peptic ulcer disease needs to be ruled out at this point.  RECOMMENDATIONS:  We would like to schedule an EGD. Risks and benefits were reviewed with him, and he is agreeable.  Patient also advised to stop drinking alcohal. We appreciate the opportunity to participate in the care of this gentleman.    ______________________________ Dorene Ar, NP   ______________________________ Lionel December, M.D.    TS/MEDQ  D:  07/08/2010  T:  07/09/2010  Job:  [id number]  cc:   [facility]  Dr. Phillips Odor  Electronically  Signed by Dorene Ar PA on 07/12/2010 10:52:33 AM Electronically Signed by Lionel December M.D. on 08/01/2010 12:16:02 PM

## 2010-08-04 ENCOUNTER — Other Ambulatory Visit (HOSPITAL_COMMUNITY): Payer: Self-pay | Admitting: Family Medicine

## 2010-08-04 ENCOUNTER — Ambulatory Visit (HOSPITAL_COMMUNITY)
Admission: RE | Admit: 2010-08-04 | Discharge: 2010-08-04 | Disposition: A | Discharge status: Home / Self Care | Payer: BC Managed Care – PPO | Source: Ambulatory Visit | Attending: Family Medicine | Admitting: Family Medicine

## 2010-08-04 DIAGNOSIS — R0789 Other chest pain: Secondary | ICD-10-CM | POA: Insufficient documentation

## 2010-08-04 DIAGNOSIS — S298XXA Other specified injuries of thorax, initial encounter: Secondary | ICD-10-CM

## 2010-08-04 DIAGNOSIS — A048 Other specified bacterial intestinal infections: Secondary | ICD-10-CM

## 2010-08-04 DIAGNOSIS — R937 Abnormal findings on diagnostic imaging of other parts of musculoskeletal system: Secondary | ICD-10-CM | POA: Insufficient documentation

## 2010-08-04 IMAGING — CR DG CHEST 2V
2 series · 2 of 2 positions shown · non-contrast
Comparison: None.

CLINICAL DATA: Right chest wall pain - fell in [place]

CHEST - 2 VIEW

[view not recorded (1 of 2)]
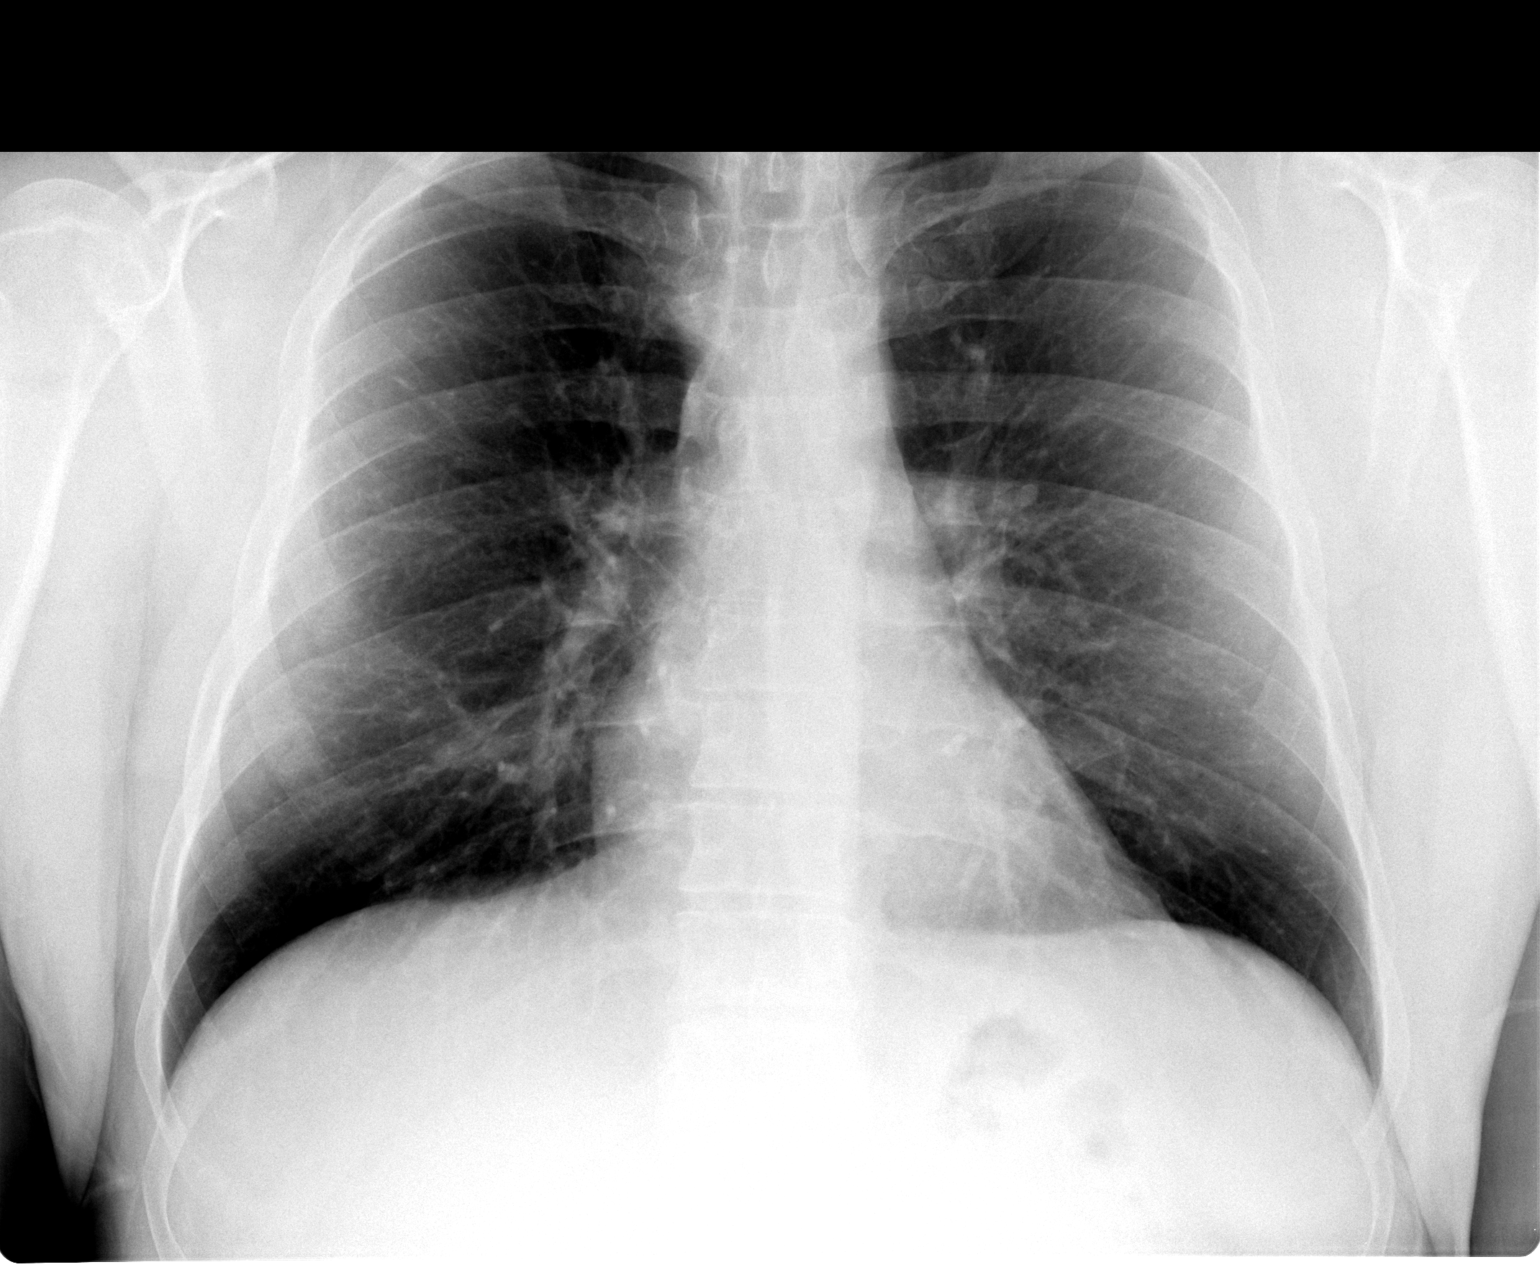

[view not recorded (2 of 2)]
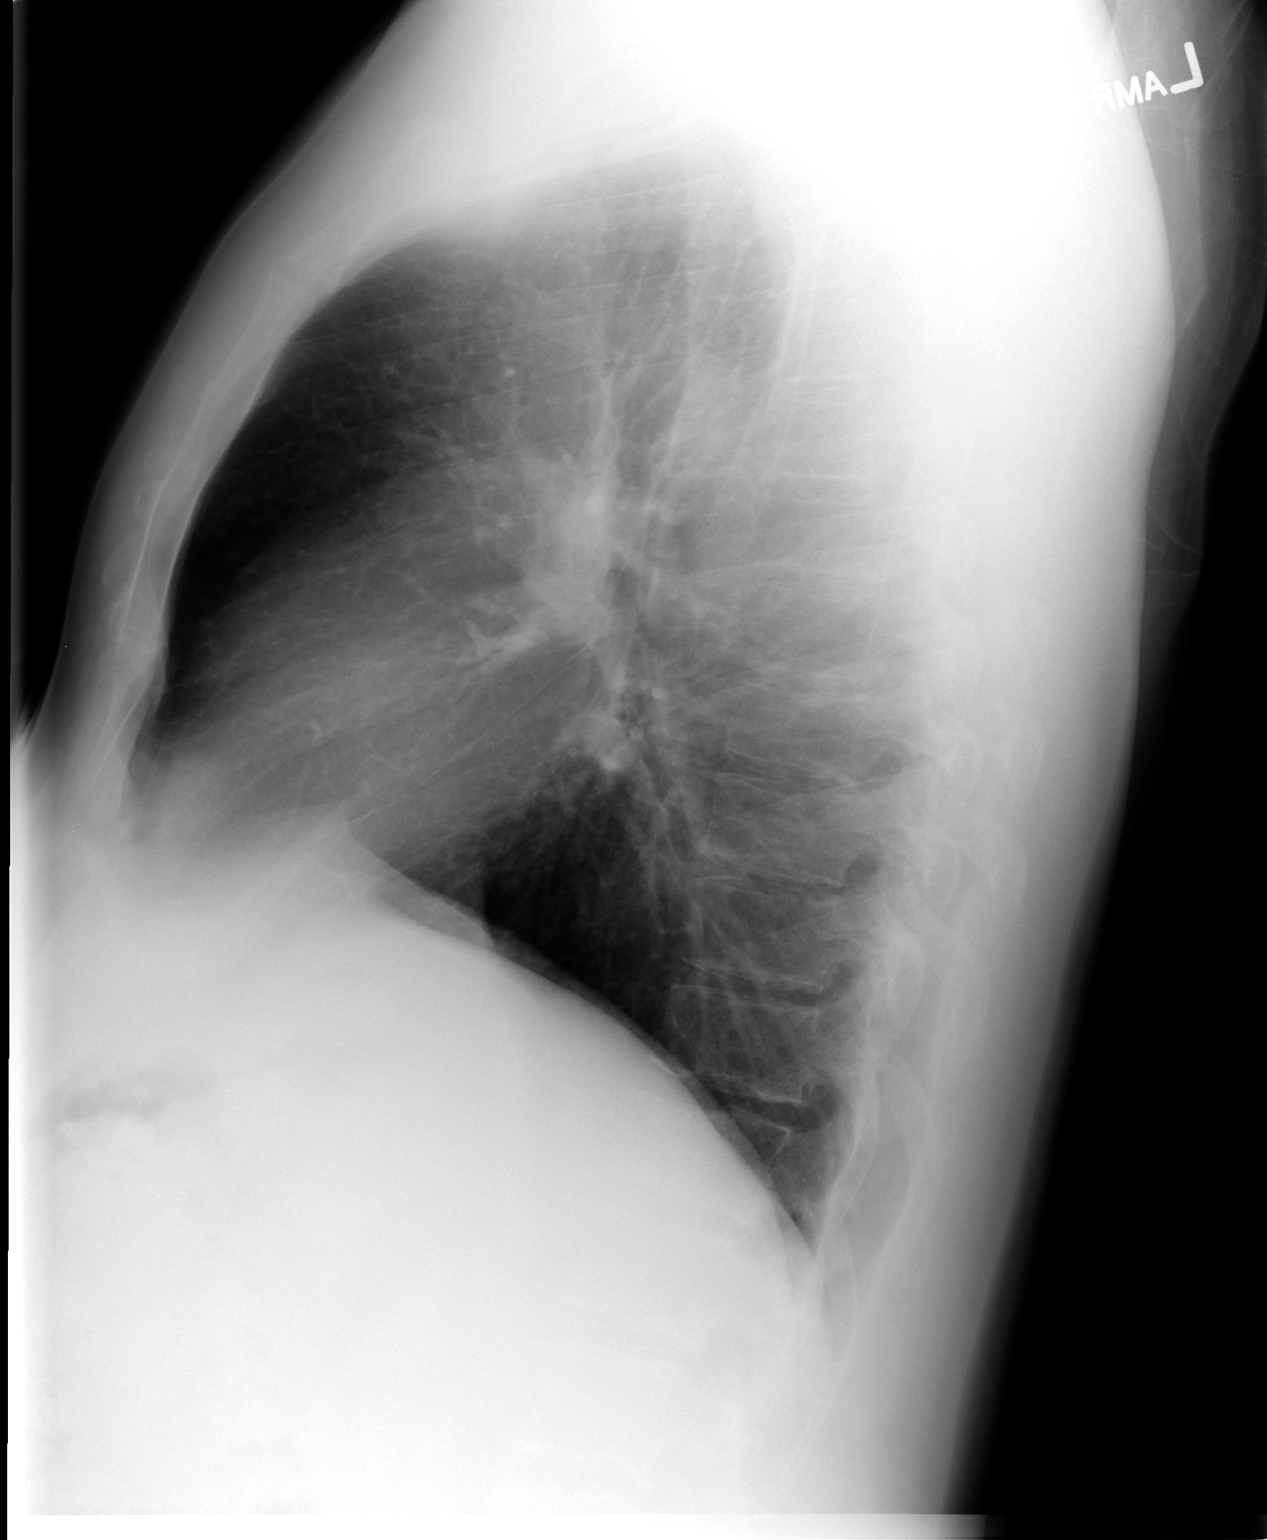

[2 of 2 positions shown; findings below may reference images not displayed]

FINDINGS: Heart and mediastinal contours normal.  Lungs clear.  No
pleural fluid.

There is relative increased density of the right anterior fifth and
sixth ribs compared to the left, and compared to adjacent ribs.
This may be an artifact, but raises the question of sclerotic
disease in these ribs. There is a suggestion that there may be
nondisplaced fractures of these ribs.  This needs clinical
correlation.  If the patient is having pain in this region, I would
recommend dedicated rib views.  If no fractures are noted on
dedicated rib views, but there is still a suggestion of sclerosis
of these ribs, a nuclear bone scan may be warranted to assess for
sclerotic metastatic disease.
IMPRESSION: 1.  Relative increased density of the right anterior fifth and
sixth ribs.
2.  Question nondisplaced fractures.  See above discussion.

## 2010-08-05 ENCOUNTER — Ambulatory Visit (HOSPITAL_COMMUNITY)
Admission: RE | Admit: 2010-08-05 | Discharge: 2010-08-05 | Disposition: A | Discharge status: Home / Self Care | Payer: BC Managed Care – PPO | Source: Ambulatory Visit | Attending: Family Medicine | Admitting: Family Medicine

## 2010-08-05 ENCOUNTER — Encounter (HOSPITAL_COMMUNITY): Payer: Self-pay

## 2010-08-05 ENCOUNTER — Other Ambulatory Visit (HOSPITAL_COMMUNITY): Payer: Self-pay | Admitting: Family Medicine

## 2010-08-05 DIAGNOSIS — R0789 Other chest pain: Secondary | ICD-10-CM | POA: Insufficient documentation

## 2010-08-05 DIAGNOSIS — Y9364 Activity, baseball: Secondary | ICD-10-CM | POA: Insufficient documentation

## 2010-08-05 DIAGNOSIS — X58XXXA Exposure to other specified factors, initial encounter: Secondary | ICD-10-CM | POA: Insufficient documentation

## 2010-08-05 DIAGNOSIS — R0781 Pleurodynia: Secondary | ICD-10-CM

## 2010-08-05 DIAGNOSIS — S2239XA Fracture of one rib, unspecified side, initial encounter for closed fracture: Secondary | ICD-10-CM | POA: Insufficient documentation

## 2010-08-05 IMAGING — CR DG RIBS 2V*R*
3 series · 3 of 3 positions shown · non-contrast
Comparison: 08/04/2010 chest radiograph

CLINICAL DATA: Right anterior lower rib pain, injured playing
softball, abnormal chest radiograph

RIGHT RIBS - 2 VIEW

[view not recorded (1 of 3)]
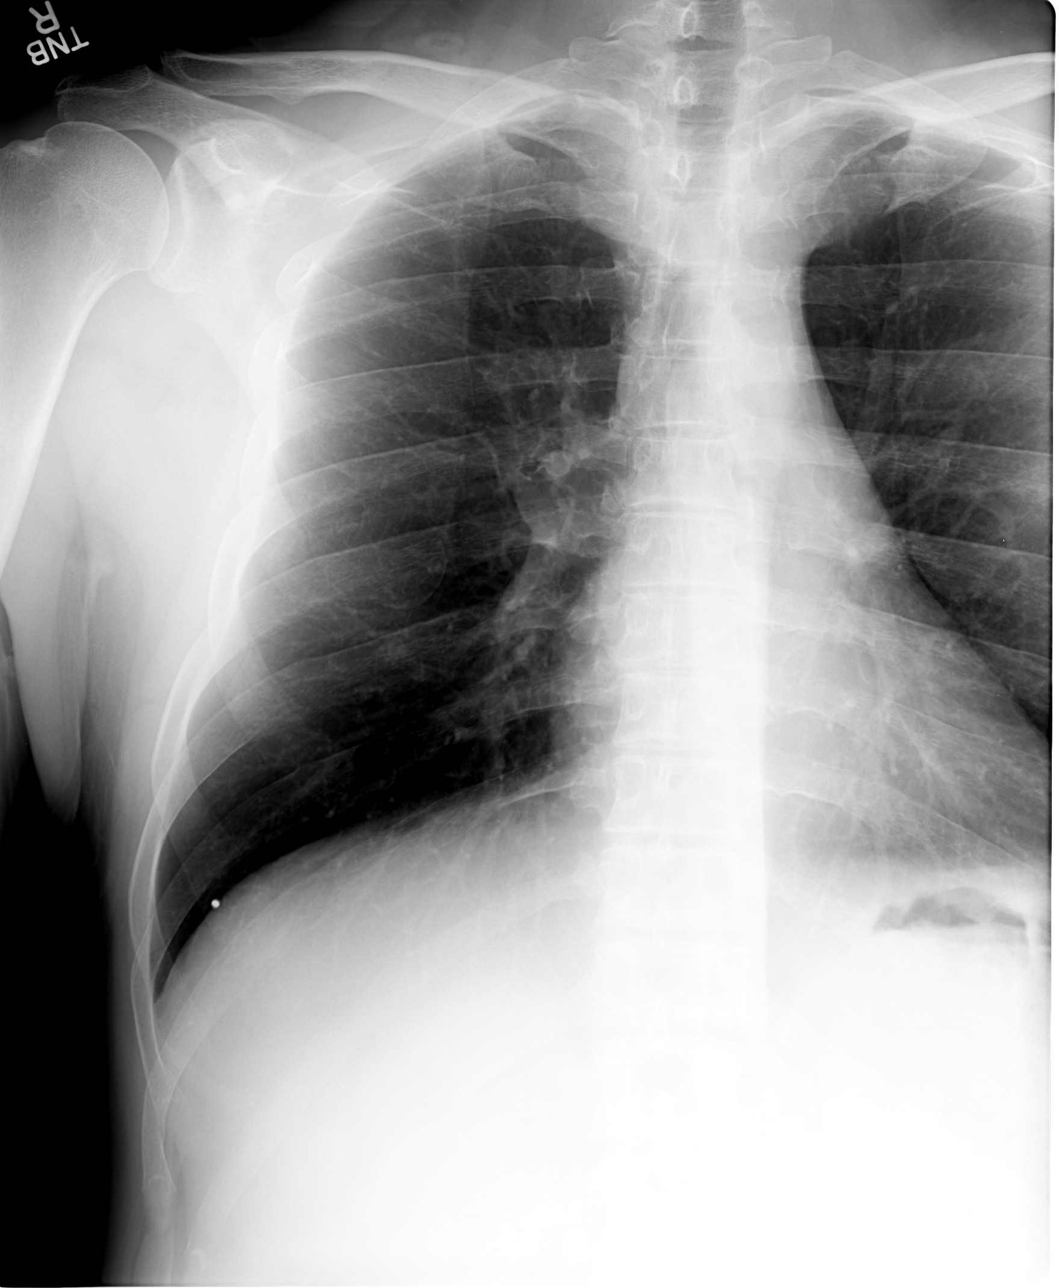

[view not recorded (2 of 3)]
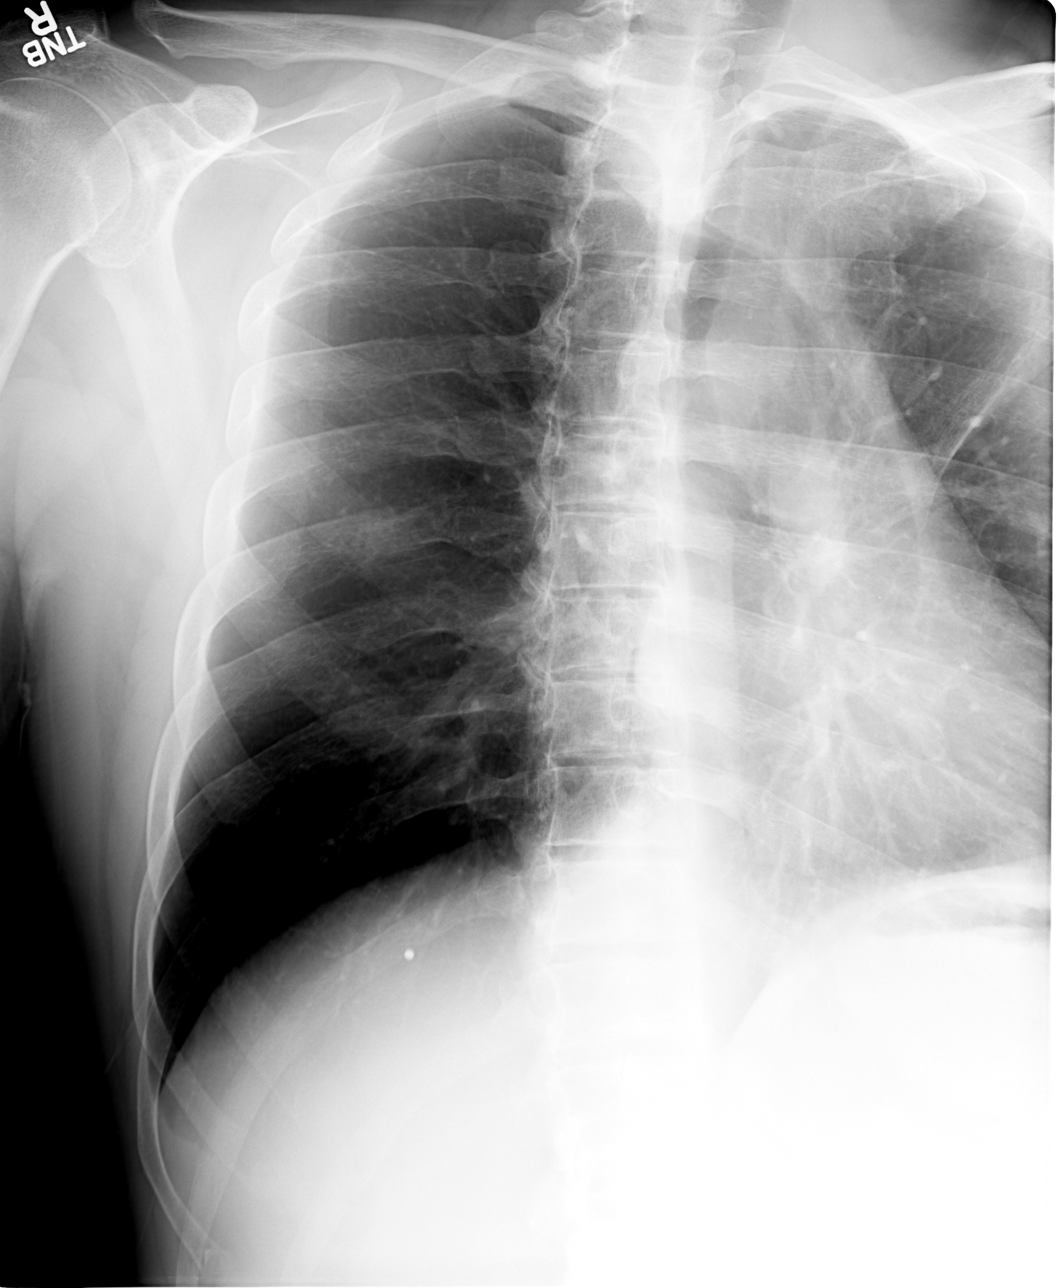

[view not recorded (3 of 3)]
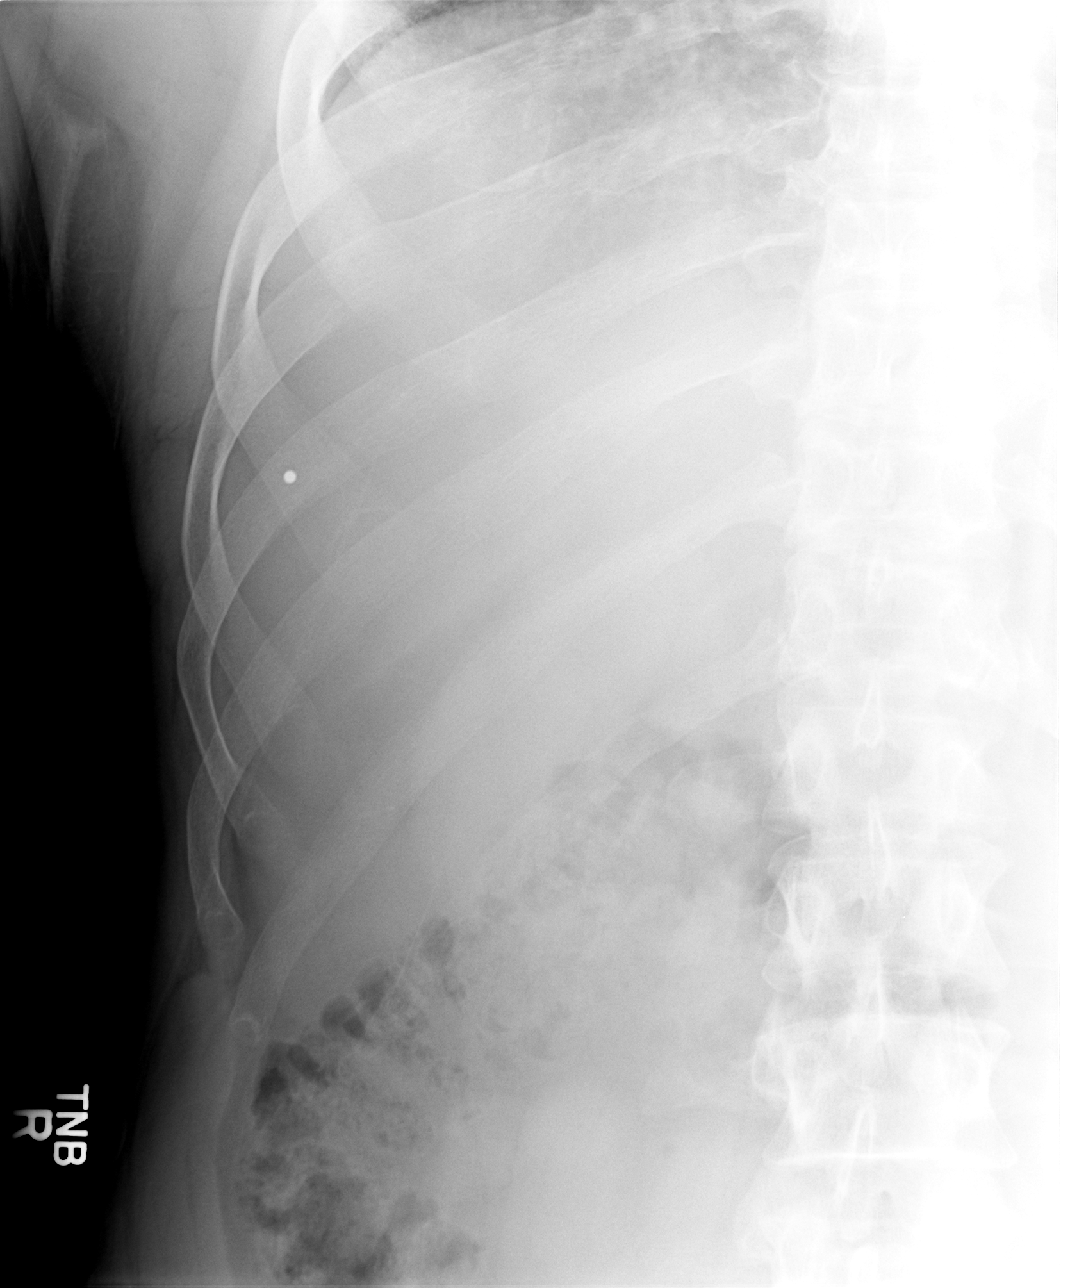

[3 of 3 positions shown; findings below may reference images not displayed]

FINDINGS: Bone density appears diminished.
BB placed at site of symptoms lower right chest.
Minimally displaced fracture identified at anterior right fifth
rib.
Questionable nondisplaced fracture anterolateral right sixth rib.
No additional focal bony abnormalities identified.
IMPRESSION: Fractured anterior right fifth and questionably sixth ribs as
above.

## 2010-09-06 ENCOUNTER — Ambulatory Visit (INDEPENDENT_AMBULATORY_CARE_PROVIDER_SITE_OTHER): Payer: BC Managed Care – PPO | Admitting: Internal Medicine

## 2010-09-06 DIAGNOSIS — R1011 Right upper quadrant pain: Secondary | ICD-10-CM

## 2010-09-06 DIAGNOSIS — K219 Gastro-esophageal reflux disease without esophagitis: Secondary | ICD-10-CM

## 2010-09-22 ENCOUNTER — Encounter (HOSPITAL_COMMUNITY): Payer: Self-pay | Admitting: *Deleted

## 2010-09-22 DIAGNOSIS — R7401 Elevation of levels of liver transaminase levels: Secondary | ICD-10-CM | POA: Insufficient documentation

## 2010-09-22 DIAGNOSIS — E876 Hypokalemia: Secondary | ICD-10-CM | POA: Insufficient documentation

## 2010-09-22 DIAGNOSIS — F1010 Alcohol abuse, uncomplicated: Secondary | ICD-10-CM | POA: Insufficient documentation

## 2010-09-22 DIAGNOSIS — R7402 Elevation of levels of lactic acid dehydrogenase (LDH): Secondary | ICD-10-CM | POA: Insufficient documentation

## 2010-09-22 DIAGNOSIS — F341 Dysthymic disorder: Secondary | ICD-10-CM | POA: Insufficient documentation

## 2010-09-22 DIAGNOSIS — F17200 Nicotine dependence, unspecified, uncomplicated: Secondary | ICD-10-CM | POA: Insufficient documentation

## 2010-09-22 NOTE — ED Notes (Signed)
Pt denies SI or HI, wanting detox from ETOH and marjuana

## 2010-09-23 ENCOUNTER — Inpatient Hospital Stay (HOSPITAL_COMMUNITY)
Admission: RE | Admit: 2010-09-23 | Discharge: 2010-09-27 | DRG: 751 | Disposition: A | Discharge status: Home / Self Care | Payer: BC Managed Care – PPO | Source: Ambulatory Visit | Attending: Psychiatry | Admitting: Psychiatry

## 2010-09-23 ENCOUNTER — Emergency Department (HOSPITAL_COMMUNITY)
Admission: EM | Admit: 2010-09-23 | Discharge: 2010-09-23 | Disposition: A | Discharge status: Other Acute Inpatient Hospital | Payer: BC Managed Care – PPO | Attending: Emergency Medicine | Admitting: Emergency Medicine

## 2010-09-23 DIAGNOSIS — F1010 Alcohol abuse, uncomplicated: Secondary | ICD-10-CM

## 2010-09-23 DIAGNOSIS — Z6379 Other stressful life events affecting family and household: Secondary | ICD-10-CM

## 2010-09-23 DIAGNOSIS — F1020 Alcohol dependence, uncomplicated: Secondary | ICD-10-CM

## 2010-09-23 HISTORY — DX: Depression, unspecified: F32.A

## 2010-09-23 HISTORY — DX: Anxiety disorder, unspecified: F41.9

## 2010-09-23 HISTORY — DX: Major depressive disorder, single episode, unspecified: F32.9

## 2010-09-23 LAB — CBC
HCT: 41.8 % (ref 39.0–52.0)
Hemoglobin: 15 g/dL (ref 13.0–17.0)
MCH: 32.8 pg (ref 26.0–34.0)
MCHC: 35.9 g/dL (ref 30.0–36.0)
MCV: 91.3 fL (ref 78.0–100.0)
Platelets: 187 10*3/uL (ref 150–400)
RBC: 4.58 MIL/uL (ref 4.22–5.81)
RDW: 12.1 % (ref 11.5–15.5)
WBC: 6.3 10*3/uL (ref 4.0–10.5)

## 2010-09-23 LAB — COMPREHENSIVE METABOLIC PANEL
ALT: 98 U/L — ABNORMAL HIGH (ref 0–53)
AST: 60 U/L — ABNORMAL HIGH (ref 0–37)
Albumin: 3.9 g/dL (ref 3.5–5.2)
Alkaline Phosphatase: 88 U/L (ref 39–117)
BUN: 11 mg/dL (ref 6–23)
CO2: 20 mEq/L (ref 19–32)
Calcium: 9.7 mg/dL (ref 8.4–10.5)
Chloride: 99 mEq/L (ref 96–112)
Creatinine, Ser: 1.02 mg/dL (ref 0.50–1.35)
GFR calc Af Amer: >60 mL/min (ref >60)
GFR calc non Af Amer: >60 mL/min (ref >60)
Glucose, Bld: 141 mg/dL — ABNORMAL HIGH (ref 70–99)
Potassium: 3.2 mEq/L — ABNORMAL LOW (ref 3.5–5.1)
Sodium: 135 mEq/L (ref 135–145)
Total Bilirubin: 0.3 mg/dL (ref 0.3–1.2)
Total Protein: 8 g/dL (ref 6.0–8.3)

## 2010-09-23 LAB — DIFFERENTIAL
Basophils Absolute: 0.1 10*3/uL (ref 0.0–0.1)
Basophils Relative: 1 % (ref 0–1)
Eosinophils Absolute: 0.2 10*3/uL (ref 0.0–0.7)
Eosinophils Relative: 3 % (ref 0–5)
Lymphocytes Relative: 36 % (ref 12–46)
Lymphs Abs: 2.3 10*3/uL (ref 0.7–4.0)
Monocytes Absolute: 0.6 10*3/uL (ref 0.1–1.0)
Monocytes Relative: 9 % (ref 3–12)
Neutro Abs: 3.2 10*3/uL (ref 1.7–7.7)
Neutrophils Relative %: 51 % (ref 43–77)

## 2010-09-23 LAB — RAPID URINE DRUG SCREEN, HOSP PERFORMED
Amphetamines: NOT DETECTED
Barbiturates: NOT DETECTED
Benzodiazepines: POSITIVE — AB
Cocaine: NOT DETECTED
Opiates: NOT DETECTED
Tetrahydrocannabinol: POSITIVE — AB

## 2010-09-23 LAB — ETHANOL: Alcohol, Ethyl (B): 97 mg/dL — ABNORMAL HIGH (ref 0–11)

## 2010-09-23 MED ORDER — POTASSIUM CHLORIDE CRYS ER 20 MEQ PO TBCR
20.0000 meq | EXTENDED_RELEASE_TABLET | Freq: Once | ORAL | Status: AC
  Administered 2010-09-23: 20 meq via ORAL
  Filled 2010-09-23: qty 1

## 2010-09-23 NOTE — ED Notes (Signed)
Pt given food to eat at his request.  Family member is at bedside.  Pt made aware that [facility].  Pt denies complaint at this time.

## 2010-09-23 NOTE — ED Provider Notes (Signed)
History     Chief Complaint  Patient presents with  . Medical Clearance    detox from ETOH and marjuana   HPI History of Present Illness   Patient Identification George [name] is a 45 y.o. male.  Patient information was obtained from patient. History/Exam limitations: none. Patient presented voluntarily to the Emergency Department by private vehicle.  Chief Complaint  Medical Clearance   Patient presents for psychiatric evaluation and requires medical clearance exam. Patient is brought by himself and wife. He is not placed on Mental Health Hold and is not requiring physical restraint. Patient requires psychiatric evaluation with concern for requesting alcohol rehab/detox. He has no associated symptoms. Patient was referred by a therapist or psychiatrist- his wife spoke to [facility] prior to coming tonight and was told to come to ED for medical clearance. Patient has a history of alcohol abuse, for which he has never been hospitalized. Pt states he was placed on a benzodiazepine due to elevated liver enzymes in an effort to get him to stop drinking, but this has not helped  Patient complains of requesting help with alcohol detox. Onset of symptoms was gradual starting several months ago. Symptoms are of moderate severity and are unchanged. Patient states symptoms have been exacerbated by nothing. Symptoms are associated with no HI/SI. History obtained from: patient. Care prior to arrival consisted of trial of oral benzos, with no relief.   Past Medical History  Diagnosis Date  . Depression   . Anxiety    History reviewed. No pertinent family history. Current Facility-Administered Medications  Medication Dose Route Frequency Provider Last Rate Last Dose  . potassium chloride SA (K-DUR,KLOR-CON) CR tablet 20 mEq  20 mEq Oral Once Ethelda Chick, MD       Current Outpatient Prescriptions  Medication Sig Dispense Refill  . CLONAZEPAM PO Take by mouth.        . Escitalopram Oxalate  (LEXAPRO PO) Take by mouth.         Allergies  Allergen Reactions  . Relafen (Nabumetone)    History   Social History  . Marital Status: Married    Spouse Name: N/A    Number of Children: N/A  . Years of Education: N/A   Occupational History  . Not on file.   Social History Main Topics  . Smoking status: Current Everyday Smoker  . Smokeless tobacco: Not on file  . Alcohol Use: Yes     8 beers a day on average  . Drug Use: 7 per week    Special: Marijuana     1/2 joiint every day  . Sexually Active:    Other Topics Concern  . Not on file   Social History Narrative  . No narrative on file   Review of Systems Pertinent items are noted in HPI.  10 Systems reviewed and are negative for acute change except as noted in the HPI. Physical Exam   BP 111/73  Pulse 107  Temp(Src) 98.4 F (36.9 C) (Oral)  Resp 20  Ht 5\' 9"  (1.753 m)  Wt 188 lb (85.276 kg)  BMI 27.76 kg/m2  SpO2 95% BP 111/73  Pulse 107  Temp(Src) 98.4 F (36.9 C) (Oral)  Resp 20  Ht 5\' 9"  (1.753 m)  Wt 188 lb (85.276 kg)  BMI 27.76 kg/m2  SpO2 95% General appearance: alert and cooperative, mild intoxciation Head: Normocephalic, without obvious abnormality, atraumatic Eyes: negative Throat: MMM Lungs: clear to auscultation bilaterally Heart: regular rate and rhythm, S1, S2  normal, no murmur, click, rub or gallop Abdomen: soft, non-tender; bowel sounds normal; no masses,  no organomegaly Extremities: extremities normal, atraumatic, no cyanosis or edema Pulses: 2+ and symmetric Skin: Skin color, texture, turgor normal. No rashes or lesions Psych- alert, cooperative, mild intoxication  ED Course   Studies: Lab: medical clearance labs revealed mild hypokalemia, elevated transaminases, etoh 97  Records Reviewed: none  Treatments: None.  Consultations: [facility] team has seen and evaluated patient in the ED  Disposition: awaiting placement for alcohol rehab.    Past Medical History    Diagnosis Date  . Depression   . Anxiety     Past Surgical History  Procedure Date  . Wrist shoulder   . Shoulder arthroscopy   . Vasectomy     History reviewed. No pertinent family history.  History  Substance Use Topics  . Smoking status: Current Everyday Smoker  . Smokeless tobacco: Not on file  . Alcohol Use: Yes     8 beers a day on average      Review of Systems  Physical Exam  BP 111/73  Pulse 107  Temp(Src) 98.4 F (36.9 C) (Oral)  Resp 20  Ht 5\' 9"  (1.753 m)  Wt 188 lb (85.276 kg)  BMI 27.76 kg/m2  SpO2 95%  Physical Exam  ED Course  Procedures  MDM Pt is voluntarily here for alcohol rehab.  Has mild hypokalemia- treated, mild elevation of transaminases- likely related to alcohol intake.  Medically cleared.  Awaiting placement for alcohol     7:46 AM Pt has been seen by ACT team last night, no issues while awaiting placement overnight

## 2010-09-23 NOTE — ED Notes (Signed)
Pt transferred to [facility] via [facility] in stable condition.

## 2010-09-23 NOTE — ED Notes (Signed)
Pt and family member resting.  No complaints at this time.

## 2010-09-23 NOTE — ED Notes (Signed)
Pt requests that he be given his home meds but unable to tell me the dosages. Will call [facility] and get dosages when they open. Pt resting comfortably at present. Denies suicidal or homicidal ideations. Family at bedside.

## 2010-09-23 NOTE — ED Provider Notes (Signed)
History     Chief Complaint  Patient presents with  . Medical Clearance    detox from ETOH and marjuana   Patient is a 45 y.o. male presenting with drug/alcohol assessment. The history is provided by the patient.  Drug / Alcohol Assessment Primary symptoms include agitation. This is a recurrent problem. Episode onset: unknown. The problem has been gradually worsening. Suspected agents include alcohol (MJ). Associated symptoms comments: agitation. Associated medical issues include withdrawal syndrome.   Pt wants help for his alcohol and MJ problem Past Medical History  Diagnosis Date  . Depression   . Anxiety     Past Surgical History  Procedure Date  . Wrist shoulder   . Shoulder arthroscopy   . Vasectomy     History reviewed. No pertinent family history.  History  Substance Use Topics  . Smoking status: Current Everyday Smoker  . Smokeless tobacco: Not on file  . Alcohol Use: Yes     8 beers a day on average      Review of Systems  Psychiatric/Behavioral: Positive for agitation.  All other systems reviewed and are negative.    Physical Exam  BP 115/63  Pulse 85  Temp(Src) 98.4 F (36.9 C) (Oral)  Resp 20  Ht 5\' 9"  (1.753 m)  Wt 188 lb (85.276 kg)  BMI 27.76 kg/m2  SpO2 95%  Physical Exam  Nursing note and vitals reviewed. Constitutional: He is oriented to person, place, and time. He appears well-developed and well-nourished.  HENT:  Head: Normocephalic and atraumatic.  Eyes: Conjunctivae and EOM are normal. Pupils are equal, round, and reactive to light.  Neck: Normal range of motion. Neck supple.  Cardiovascular: Normal rate and regular rhythm.   Pulmonary/Chest: Effort normal and breath sounds normal.  Abdominal: Soft. Bowel sounds are normal.  Musculoskeletal: Normal range of motion.  Neurological: He is alert and oriented to person, place, and time.       agitated  Skin: Skin is warm and dry.  Psychiatric: He has a normal mood and affect.     ED Course  Procedures  MDM [facility] consult;  No s/h ideation    Results for orders placed during the hospital encounter of 09/23/10  CBC      Component Value Range   WBC 6.3  4.0 - 10.5 (K/uL)   RBC 4.58  4.22 - 5.81 (MIL/uL)   Hemoglobin 15.0  13.0 - 17.0 (g/dL)   HCT 82.9  56.2 - 13.0 (%)   MCV 91.3  78.0 - 100.0 (fL)   MCH 32.8  26.0 - 34.0 (pg)   MCHC 35.9  30.0 - 36.0 (g/dL)   RDW 86.5  78.4 - 69.6 (%)   Platelets 187  150 - 400 (K/uL)  DIFFERENTIAL      Component Value Range   Neutrophils Relative 51  43 - 77 (%)   Neutro Abs 3.2  1.7 - 7.7 (K/uL)   Lymphocytes Relative 36  12 - 46 (%)   Lymphs Abs 2.3  0.7 - 4.0 (K/uL)   Monocytes Relative 9  3 - 12 (%)   Monocytes Absolute 0.6  0.1 - 1.0 (K/uL)   Eosinophils Relative 3  0 - 5 (%)   Eosinophils Absolute 0.2  0.0 - 0.7 (K/uL)   Basophils Relative 1  0 - 1 (%)   Basophils Absolute 0.1  0.0 - 0.1 (K/uL)  ETHANOL      Component Value Range   Alcohol, Ethyl (B) 97 (*) 0 - 11 (  mg/dL)  DRUG SCREEN PANEL, EMERGENCY      Component Value Range   Opiates NONE DETECTED  NONE DETECTED    Cocaine NONE DETECTED  NONE DETECTED    Benzodiazepines POSITIVE (*) NONE DETECTED    Amphetamines NONE DETECTED  NONE DETECTED    Tetrahydrocannabinol POSITIVE (*) NONE DETECTED    Barbiturates NONE DETECTED  NONE DETECTED   COMPREHENSIVE METABOLIC PANEL      Component Value Range   Sodium 135  135 - 145 (mEq/L)   Potassium 3.2 (*) 3.5 - 5.1 (mEq/L)   Chloride 99  96 - 112 (mEq/L)   CO2 20  19 - 32 (mEq/L)   Glucose, Bld 141 (*) 70 - 99 (mg/dL)   BUN 11  6 - 23 (mg/dL)   Creatinine, Ser 1.61  0.50 - 1.35 (mg/dL)   Calcium 9.7  8.4 - 09.6 (mg/dL)   Total Protein 8.0  6.0 - 8.3 (g/dL)   Albumin 3.9  3.5 - 5.2 (g/dL)   AST 60 (*) 0 - 37 (U/L)   ALT 98 (*) 0 - 53 (U/L)   Alkaline Phosphatase 88  39 - 117 (U/L)   Total Bilirubin 0.3  0.3 - 1.2 (mg/dL)   GFR calc non Af Amer >60  >60 (mL/min)   GFR calc Af Amer >60  >60 (mL/min)    No results found.   Donnetta Hutching, MD 10/08/10 [phone]

## 2010-09-26 LAB — URINALYSIS, ROUTINE W REFLEX MICROSCOPIC
Bilirubin Urine: NEGATIVE
Ketones, ur: NEGATIVE mg/dL
Nitrite: NEGATIVE
Protein, ur: NEGATIVE mg/dL
Urobilinogen, UA: 0.2 mg/dL (ref 0.0–1.0)

## 2010-09-28 LAB — URINE CULTURE
Colony Count: NO GROWTH
Culture: NO GROWTH

## 2010-10-01 NOTE — Assessment & Plan Note (Signed)
NAMEMarland Kitchen  George [name], George [name] NO.:  [id number]  MEDICAL RECORD NO.:  [id number]  LOCATION:  0302                          FACILITY:  [facility]  PHYSICIAN:  Orson Aloe, MD       DATE OF BIRTH:  1965-10-10  DATE OF ADMISSION:  09/23/2010 DATE OF DISCHARGE:                      PSYCHIATRIC ADMISSION ASSESSMENT   This is a voluntary admission to the services of Dr. Orson Aloe.  This is a 45 year old, married, white male, apparently his wife threatened to walk out on him unless he got help for his alcohol dependence.  He had gone to his GI doctor 4 days ago requesting help. They did prescribe him some Librium 25 mg p.o. q.6 hours; however, this was not strong enough and they presented to the ED at [facility].  He drinks 8 to 18 12-ounce beers a day.  He also smokes 1/4 ounce of marijuana a month.  His drinking is causing marital conflict and agitation on the job, and hence he is presenting for treatment. Apparently, last year he began to get psychiatric care.  He could not sleep.  He would get angry at work.  He was having lots of conflict with a prior supervisor.  He switched jobs and this had many economic ramifications and loss of benefits, but according to him he, up until recently, was "a functional alcoholic."  He states his father gave him alcohol as a kid.  His father, unfortunately, has cirrhosis.  PAST PSYCHIATRIC HISTORY:  He has had one prior detox 12 years ago, actually at [facility].  That would be prior to moving to this facility. He is seeing Valinda Hoar for medication management and [facility] for counseling.  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  He has been married 18 years.  He has twin daughters, age 47.  He is employed as a Chartered certified accountant.  FAMILY HISTORY:  His father is an alcoholic and does have cirrhosis.  ALCOHOL AND DRUG HISTORY:  He states that he drinks when he comes home from work, he drinks when he plays golf,  etc.  PRIMARY CARE PROVIDER:  Dr. Sherwood Gambler.  His medication management person is Valinda Hoar, NP.  MEDICAL PROBLEMS:  He has no known medical problems.  MEDICATIONS:  He is currently prescribed Lexapro 10 mg p.o. daily, and on July 22, he was prescribed some Librium.  DRUG ALLERGIES:  RELAFEN, IT GIVES HIM A RASH.  POSITIVE PHYSICAL FINDINGS:  He was cleared in the ED at [facility]. His vital signs were stable.  His pulse was 107.  His respirations were 20.  BP was 111/73.  He was afebrile at 98.7.  His potassium was a little low at 3.2.  He was replaced up there.  His alcohol level was only 97, and he has a very, very fine tremor in his hands.  MENTAL STATUS EXAM:  Tonight, he is alert and oriented.  He is appropriately, albeit, casually groomed and dressed.  He appears to be adequately nourished.  His speech had a normal rate, rhythm and tone. His mood is appropriate to the situation.  Thought processes are clear, rational and goal-oriented.  He wants to get  cleaned up.  He does not want to lose his marriage.  Judgment and insight are intact. Concentration and memory are intact.  Intelligence is at least average. He is not suicidal or homicidal.  He is not having any auditory or visual hallucinations.  DIAGNOSES:  Axis I:  Alcohol dependence, needs medically supervised detox and we will do this through the low-dose Librium protocol, anxiety. Axis II:  Deferred. Axis III:  None known. Axis IV:  Occupational, economic and chemical dependency issues. Axis V:  45.  PLAN:  The plan is to admit for safety and stabilization.  We will restart his Lexapro.  He is on the low-dose Librium protocol.  Other medications will be assessed as needed.  He already has outpatient care in place and we can return him to his providers once detoxed.     Mickie Leonarda Salon, P.A.-C.   ______________________________ Orson Aloe, MD    MD/MEDQ  D:  09/23/2010  T:  09/23/2010  Job:   [id number]  Electronically Signed by Jaci Lazier ADAMS P.A.-C. on 09/26/2010 12:22:13 PM Electronically Signed by Orson Aloe  on 10/01/2010 10:37:27 AM

## 2010-10-12 NOTE — Discharge Summary (Signed)
  NAMEMarland Kitchen  George [name], George [name] NO.:  [id number]  MEDICAL RECORD NO.:  [id number]  LOCATION:  0302                          FACILITY:  [facility]  PHYSICIAN:  Orson Aloe, MD       DATE OF BIRTH:  09-May-1965  DATE OF ADMISSION:  09/23/2010 DATE OF DISCHARGE:  09/27/2010                              DISCHARGE SUMMARY   IDENTIFYING INFORMATION:  This is a 45 year old married Caucasian male. This is a voluntary admission.  HISTORY OF PRESENT ILLNESS:  George [name] presented requesting detox from alcohol, had been drinking eighteen 12-ounces beers daily and also reported he smoked marijuana several times a month.  His drinking was causing marital conflict.  He was also having some problems on the job. He has a history of a previous detox 12 years ago at [facility] and is currently seeing George [name] for counseling and George [name] for medication management.  His primary care physician is George [name].  MEDICAL EVALUATION AND DIAGNOSTIC STUDIES:  He was medically evaluated in the [facility] emergency room where he is noted to have stable vital signs, afebrile, blood pressure 111/73 and pulse 107.  Potassium was mildly decreased at 3.2 and that was repleted in the emergency room. Alcohol level 97.  COURSE OF HOSPITALIZATION:  He was admitted to our dual diagnosis unit and we elected to continue his Lexapro 10 mg daily.  We have placed him on a low-dose Librium protocol with a goal of a safe detox from alcohol in 4 days.  Routine labs included a urinalysis which was essentially normal.  GC and chlamydia probes were negative.  While on the unit, his group participation was good.  He admitted to regular marijuana use.  Also cited marital stressors as motivation before becoming abstinent.  His wife had told him to get help or she would leave him.  He also reported that his job was in jeopardy.  He was able to state that he was invested in sobriety and was interested  in rehab.  Our case manager in consultation with him decided to pursue placement at [facility].  While on the unit, he had some issues with constipation which was alleviated with mag citrate and Dulcolax suppositories.  Gabapentin was added to manage anxiety.  By the 30th, he was experiencing no further withdrawal symptoms and that had been secured at [facility].  DISCHARGE/PLAN:  Follow up with [facility], September 29, 2010.  DISCHARGE DIAGNOSES:  AXIS I:  Alcohol dependence, rule out substance- induced mood disorder. AXIS II:  Deferred. AXIS III: Hypokalemia, repleted in the ED. AXIS IV: Occupational and domestic stressors. AXIS V: Current 57, past year not known.  DISCHARGE MEDICATIONS: 1. Gabapentin 300 mg one q.6 h as needed for anxiety. 2. Lexapro 10 mg daily for depression.     George [name], N.P.   ______________________________ Orson Aloe, MD    MAS/MEDQ  D:  10/11/2010  T:  10/11/2010  Job:  [id number]  Electronically Signed by George [name] N.P. on 10/12/2010 08:10:36 AM Electronically Signed by Orson Aloe  on 10/12/2010 05:45:44 PM

## 2010-11-03 ENCOUNTER — Telehealth (INDEPENDENT_AMBULATORY_CARE_PROVIDER_SITE_OTHER): Payer: Self-pay | Admitting: *Deleted

## 2010-11-03 DIAGNOSIS — R945 Abnormal results of liver function studies: Secondary | ICD-10-CM

## 2010-11-03 NOTE — Telephone Encounter (Signed)
Letter sent to the patient as a reminder and order faxed to [facility]

## 2010-12-01 LAB — BASIC METABOLIC PANEL
Calcium: 9.7
GFR calc Af Amer: >60
GFR calc non Af Amer: >60
Glucose, Bld: 81
Sodium: 138

## 2010-12-01 LAB — HEPATIC FUNCTION PANEL
AST: 29
Bilirubin, Direct: 0.1

## 2012-04-20 ENCOUNTER — Ambulatory Visit (HOSPITAL_COMMUNITY)
Admission: RE | Admit: 2012-04-20 | Discharge: 2012-04-20 | Disposition: A | Discharge status: Home / Self Care | Payer: PRIVATE HEALTH INSURANCE | Source: Ambulatory Visit | Attending: Orthopedic Surgery | Admitting: Orthopedic Surgery

## 2012-04-20 ENCOUNTER — Other Ambulatory Visit (HOSPITAL_COMMUNITY): Payer: Self-pay | Admitting: Orthopedic Surgery

## 2012-04-20 DIAGNOSIS — Z139 Encounter for screening, unspecified: Secondary | ICD-10-CM

## 2012-04-20 DIAGNOSIS — Z0389 Encounter for observation for other suspected diseases and conditions ruled out: Secondary | ICD-10-CM | POA: Insufficient documentation

## 2012-04-20 IMAGING — CR DG ORBITS FOR FOREIGN BODY
2 series · 2 of 2 positions shown · non-contrast
Comparison: None.

CLINICAL DATA: Orbital foreign body.  Screening for MRI.

ORBITS FOR FOREIGN BODY - 2 VIEW

[view not recorded (1 of 2)]
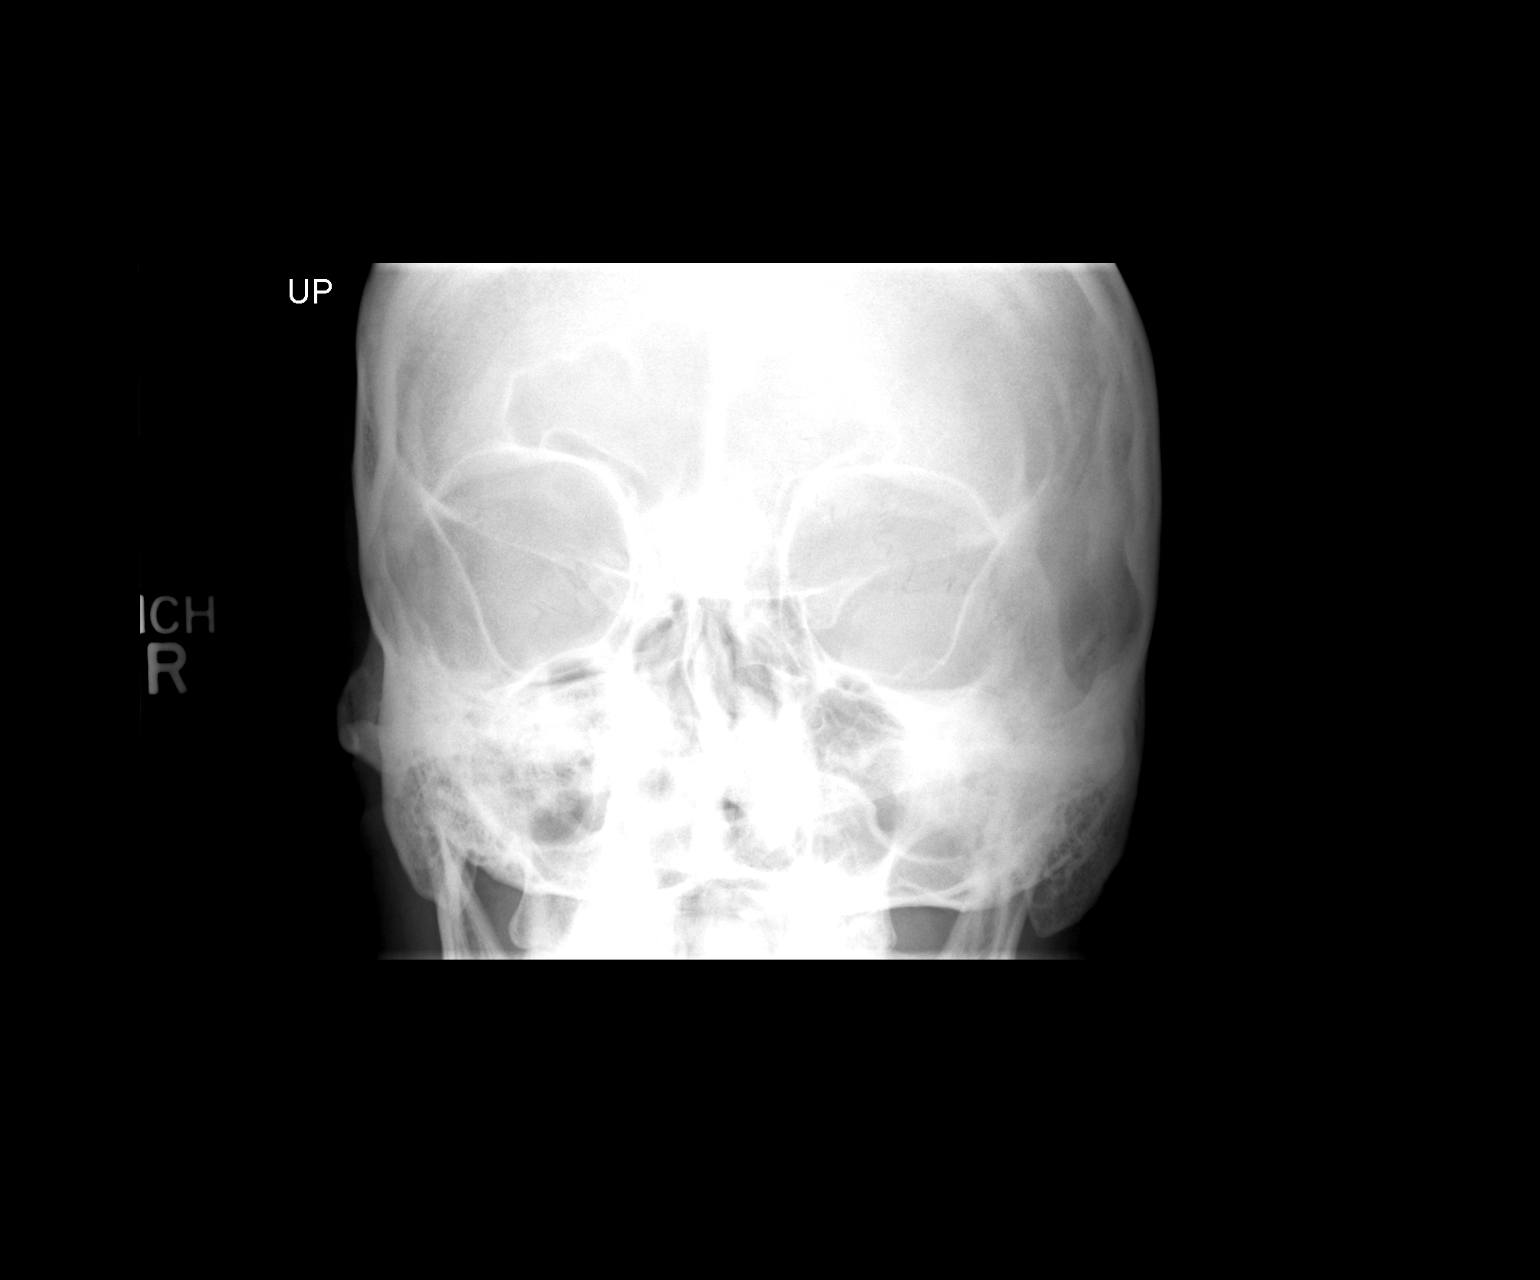

[view not recorded (2 of 2)]
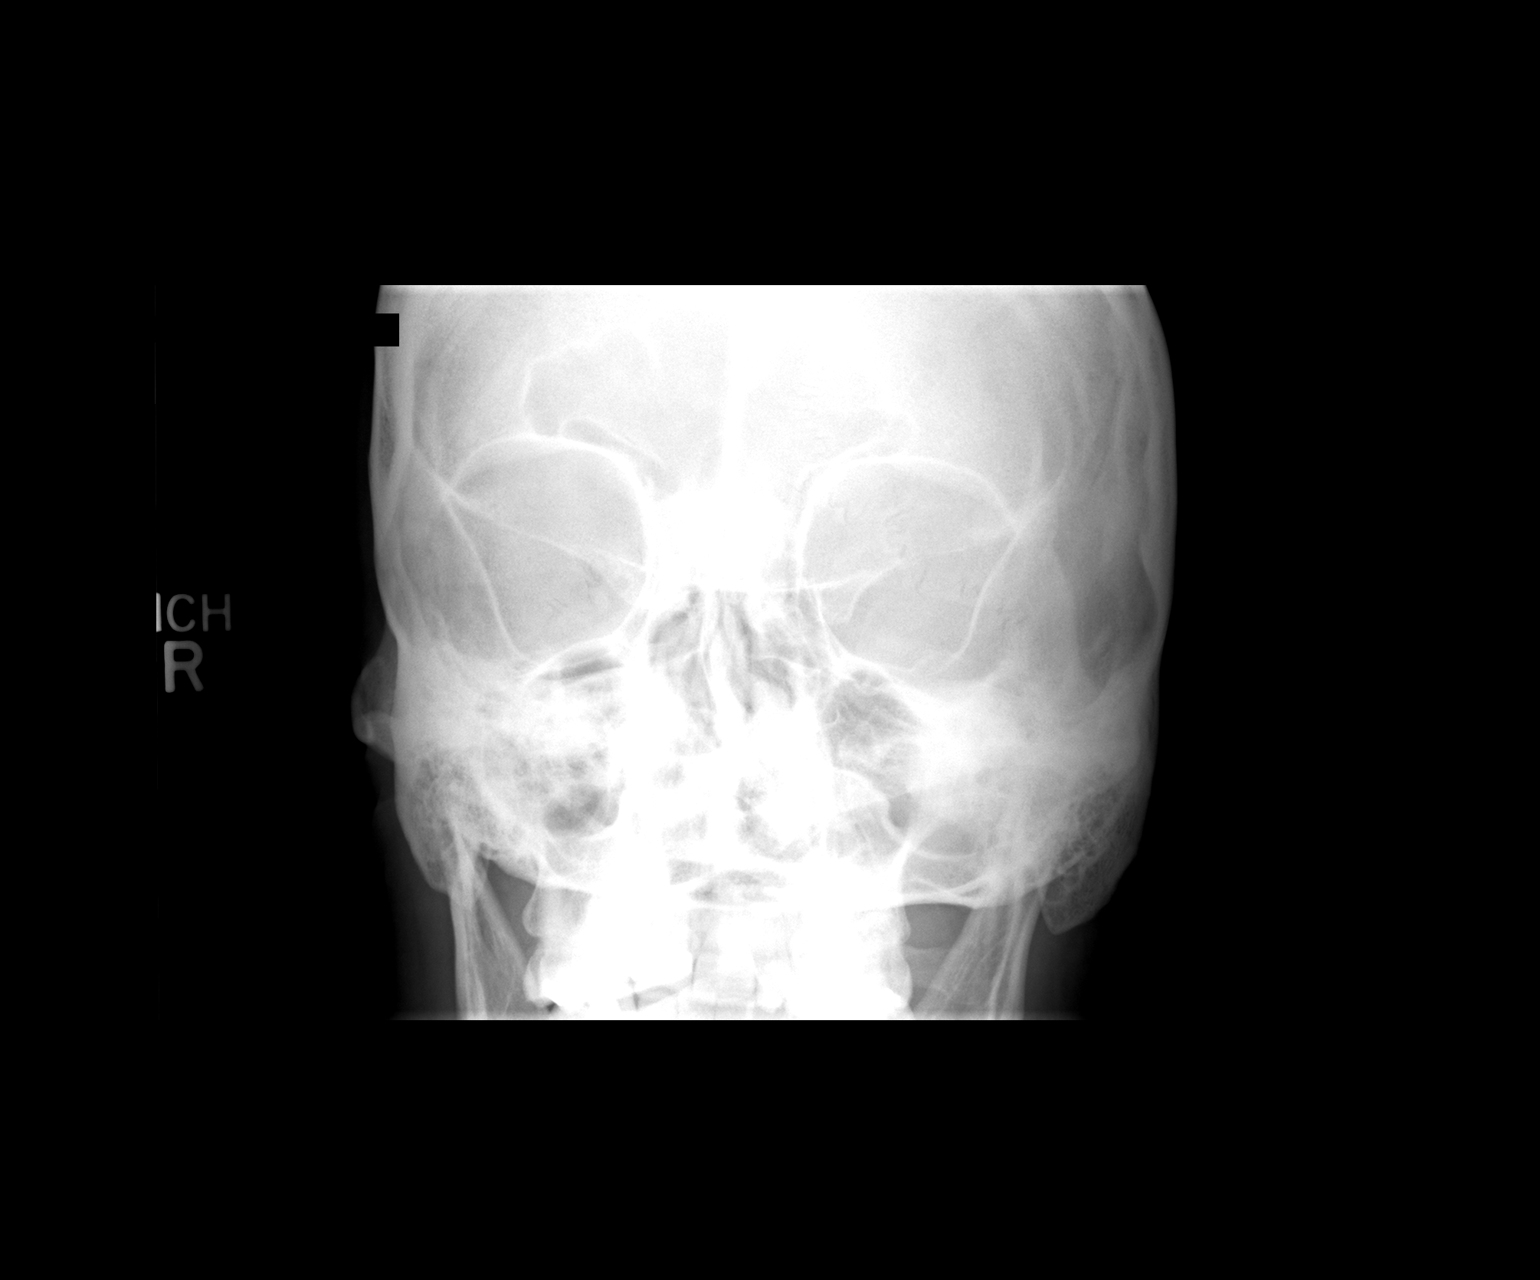

[2 of 2 positions shown; findings below may reference images not displayed]

FINDINGS: No radiopaque orbital foreign bodies.
IMPRESSION: Negative.

## 2016-08-22 ENCOUNTER — Emergency Department (HOSPITAL_COMMUNITY)
Admission: EM | Admit: 2016-08-22 | Discharge: 2016-08-22 | Discharge status: Against Medical Advice | Payer: PRIVATE HEALTH INSURANCE

## 2016-08-22 ENCOUNTER — Emergency Department (HOSPITAL_COMMUNITY)
Admission: EM | Admit: 2016-08-22 | Discharge: 2016-08-22 | Disposition: A | Discharge status: Home / Self Care | Payer: 59 | Attending: Emergency Medicine | Admitting: Emergency Medicine

## 2016-08-22 ENCOUNTER — Encounter (HOSPITAL_COMMUNITY): Payer: Self-pay | Admitting: Emergency Medicine

## 2016-08-22 DIAGNOSIS — Z87891 Personal history of nicotine dependence: Secondary | ICD-10-CM | POA: Diagnosis not present

## 2016-08-22 DIAGNOSIS — J36 Peritonsillar abscess: Secondary | ICD-10-CM | POA: Diagnosis not present

## 2016-08-22 DIAGNOSIS — J029 Acute pharyngitis, unspecified: Secondary | ICD-10-CM | POA: Diagnosis present

## 2016-08-22 MED ORDER — LIDOCAINE VISCOUS 2 % MT SOLN
OROMUCOSAL | Status: AC
  Filled 2016-08-22: qty 15

## 2016-08-22 MED ORDER — IBUPROFEN 800 MG PO TABS
800.0000 mg | ORAL_TABLET | Freq: Once | ORAL | Status: AC
  Administered 2016-08-22: 800 mg via ORAL
  Filled 2016-08-22: qty 1

## 2016-08-22 MED ORDER — LIDOCAINE VISCOUS 2 % MT SOLN
15.0000 mL | Freq: Once | OROMUCOSAL | Status: AC
  Administered 2016-08-22: 15 mL via OROMUCOSAL

## 2016-08-22 MED ORDER — CLINDAMYCIN HCL 300 MG PO CAPS: 300.0000 mg | ORAL_CAPSULE | Freq: Three times a day (TID) | ORAL | 0 refills | Status: DC

## 2016-08-22 MED ORDER — IBUPROFEN 600 MG PO TABS: 600.0000 mg | ORAL_TABLET | Freq: Four times a day (QID) | ORAL | 0 refills | Status: DC | PRN

## 2016-08-22 MED ORDER — MAGIC MOUTHWASH W/LIDOCAINE: 10.0000 mL | Freq: Four times a day (QID) | ORAL | 0 refills | Status: DC | PRN

## 2016-08-22 MED ORDER — CLINDAMYCIN HCL 150 MG PO CAPS
300.0000 mg | ORAL_CAPSULE | Freq: Once | ORAL | Status: AC
  Administered 2016-08-22: 300 mg via ORAL
  Filled 2016-08-22: qty 2

## 2016-08-22 NOTE — Discharge Instructions (Signed)
Take your next dose of the antibiotic tomorrow morning.  Maintain a soft food diet, make sure you are drinking plenty of fluids.

## 2016-08-22 NOTE — ED Notes (Signed)
Instructed pt to take all of antibiotics as prescribed. 

## 2016-08-22 NOTE — ED Triage Notes (Signed)
Patient complaining of sore throat since yesterday. States he went to Urgent Care today and was told to come to ER for swollen right tonsil.

## 2016-08-24 ENCOUNTER — Ambulatory Visit (HOSPITAL_COMMUNITY): Payer: 59 | Admitting: Anesthesiology

## 2016-08-24 ENCOUNTER — Other Ambulatory Visit: Payer: Self-pay | Admitting: Otolaryngology

## 2016-08-24 ENCOUNTER — Ambulatory Visit
Admission: RE | Admit: 2016-08-24 | Discharge: 2016-08-24 | Disposition: A | Discharge status: Home / Self Care | Payer: 59 | Source: Ambulatory Visit | Attending: Otolaryngology | Admitting: Otolaryngology

## 2016-08-24 ENCOUNTER — Encounter (HOSPITAL_COMMUNITY)
Admission: RE | Disposition: A | Discharge status: Home / Self Care | Payer: Self-pay | Source: Ambulatory Visit | Attending: Otolaryngology

## 2016-08-24 ENCOUNTER — Encounter (HOSPITAL_COMMUNITY): Payer: Self-pay | Admitting: *Deleted

## 2016-08-24 ENCOUNTER — Ambulatory Visit (HOSPITAL_COMMUNITY)
Admission: RE | Admit: 2016-08-24 | Discharge: 2016-08-24 | Disposition: A | Discharge status: Home / Self Care | Payer: 59 | Source: Ambulatory Visit | Attending: Otolaryngology | Admitting: Otolaryngology

## 2016-08-24 DIAGNOSIS — J36 Peritonsillar abscess: Secondary | ICD-10-CM

## 2016-08-24 DIAGNOSIS — Z888 Allergy status to other drugs, medicaments and biological substances status: Secondary | ICD-10-CM | POA: Insufficient documentation

## 2016-08-24 DIAGNOSIS — R070 Pain in throat: Secondary | ICD-10-CM

## 2016-08-24 DIAGNOSIS — Z87891 Personal history of nicotine dependence: Secondary | ICD-10-CM | POA: Insufficient documentation

## 2016-08-24 HISTORY — PX: INCISION AND DRAINAGE ABSCESS: SHX5864

## 2016-08-24 LAB — CBC
HEMATOCRIT: 41.7 % (ref 39.0–52.0)
HEMOGLOBIN: 15 g/dL (ref 13.0–17.0)
MCH: 32.6 pg (ref 26.0–34.0)
MCHC: 36 g/dL (ref 30.0–36.0)
MCV: 90.7 fL (ref 78.0–100.0)
Platelets: 138 10*3/uL — ABNORMAL LOW (ref 150–400)
RBC: 4.6 MIL/uL (ref 4.22–5.81)
RDW: 12.3 % (ref 11.5–15.5)
WBC: 9.9 10*3/uL (ref 4.0–10.5)

## 2016-08-24 IMAGING — CT CT NECK W/ CM
5 of 6 series · 15 of 33 positions shown, 17 images · IV contrast (iopamidol)
Comparison: None.

CLINICAL DATA: RIGHT tonsillar inflammation for 4 days. Throat
pain.

EXAM:
CT NECK WITH CONTRAST
TECHNIQUE: Multidetector CT imaging of the neck was performed using the
standard protocol following the bolus administration of intravenous
contrast.
CONTRAST:  75mL MAUOFO-SOO IOPAMIDOL (MAUOFO-SOO) INJECTION 61%

[Series 2: neck · axial · 0.48mm/px · z∈[-91,+47]mm · 3 of 139 slices shown, 4 images]
[im 35/139  soft-tissue]
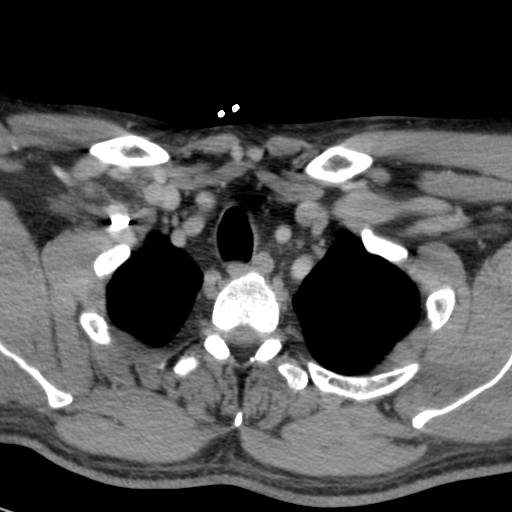
[im 35/139  bone]
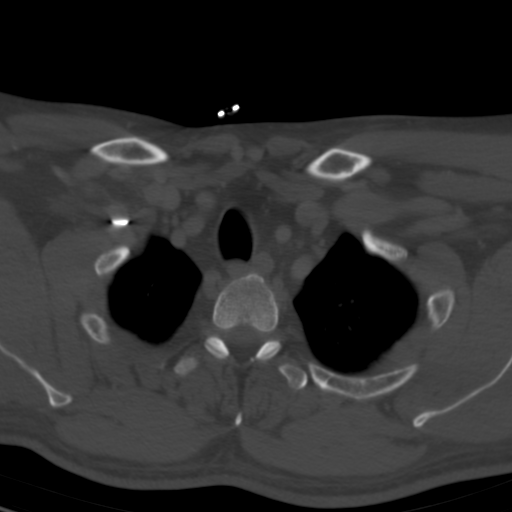
[im 70/139  bone]
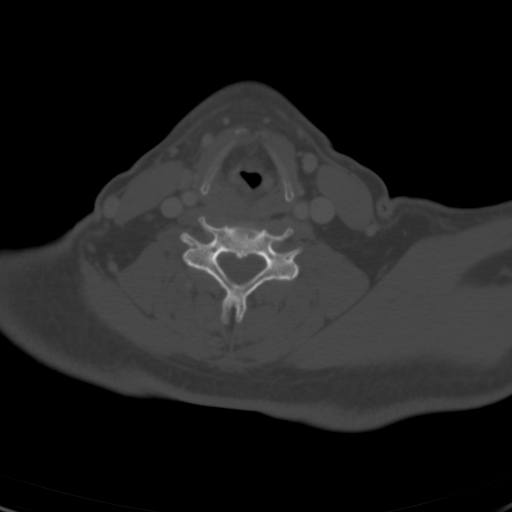
[im 104/139  bone]
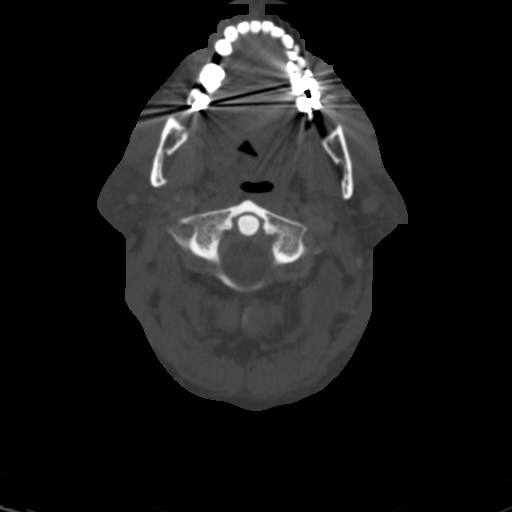

[Series 5: sag · sagittal · 0.46mm/px · 5 of 122 slices shown, 6 images]
[im 41/122  bone]
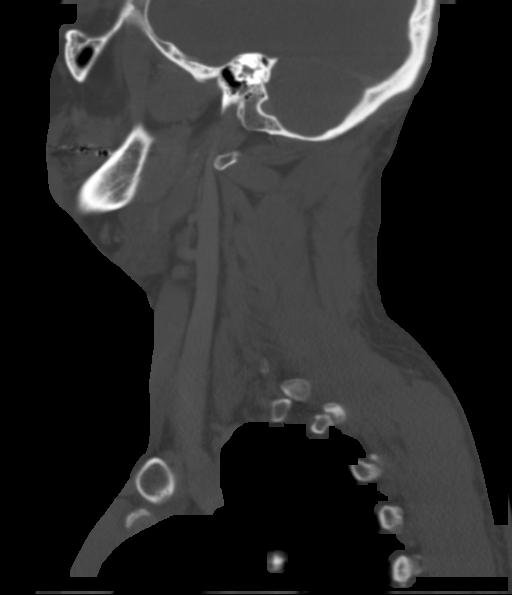
[im 51/122  bone]
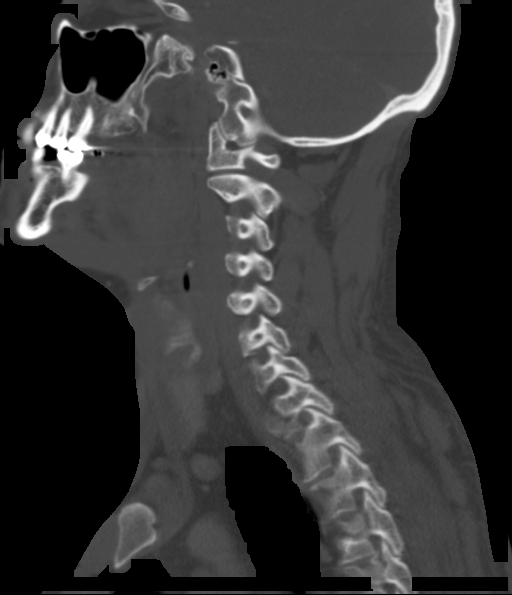
[im 61/122  soft-tissue]
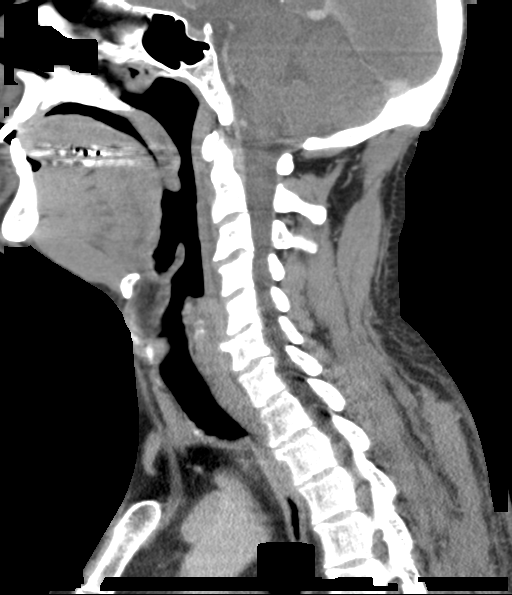
[im 61/122  bone]
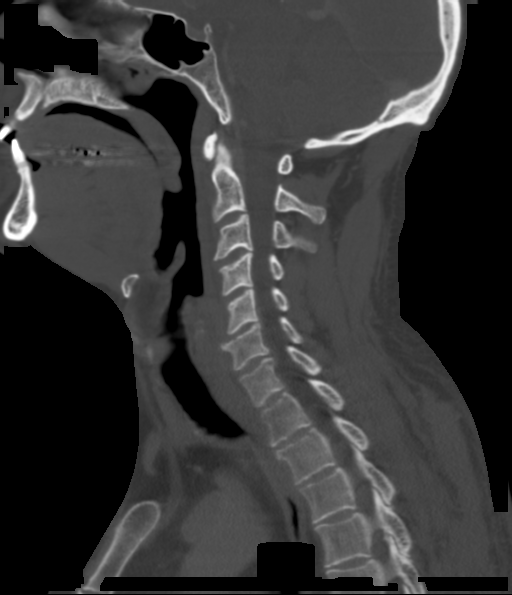
[im 71/122  bone]
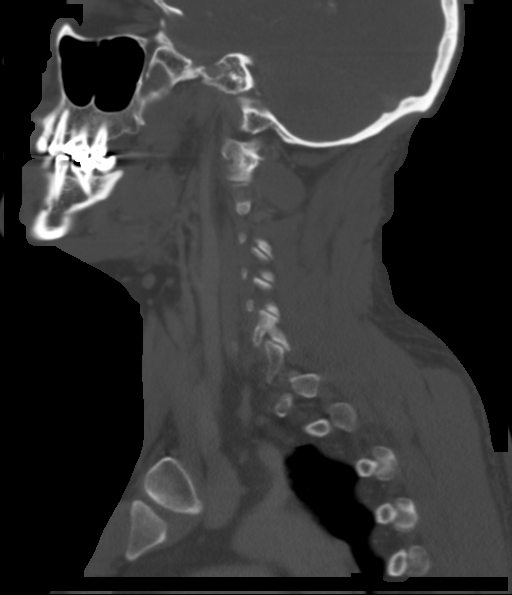
[im 81/122  bone]
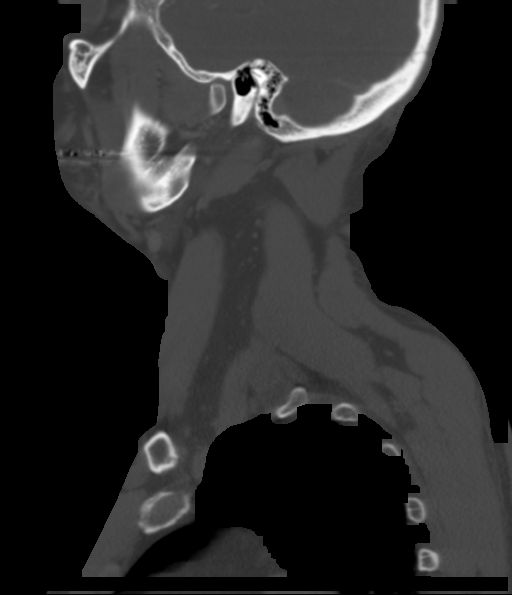

[Series 6: cor · coronal · 0.47mm/px · 3 of 120 slices shown]
[im 24/120  bone]
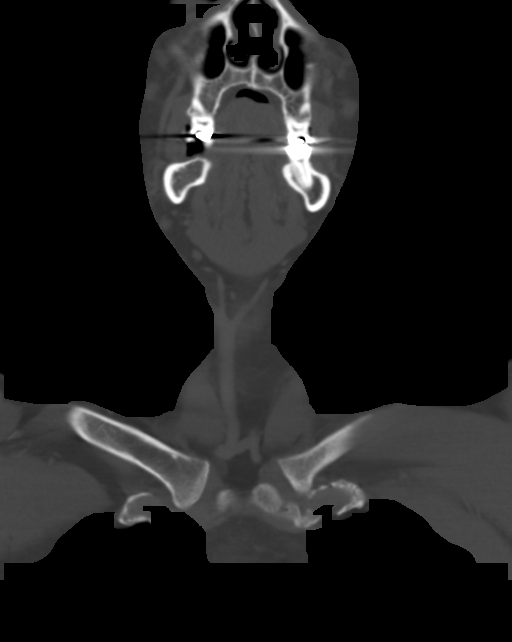
[im 48/120  bone]
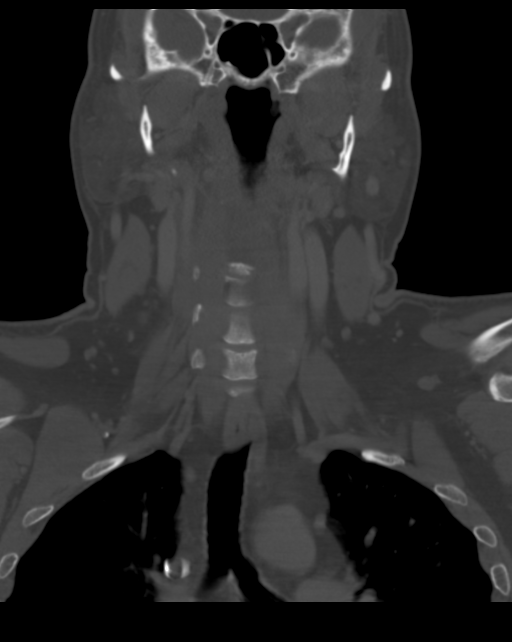
[im 72/120  bone]
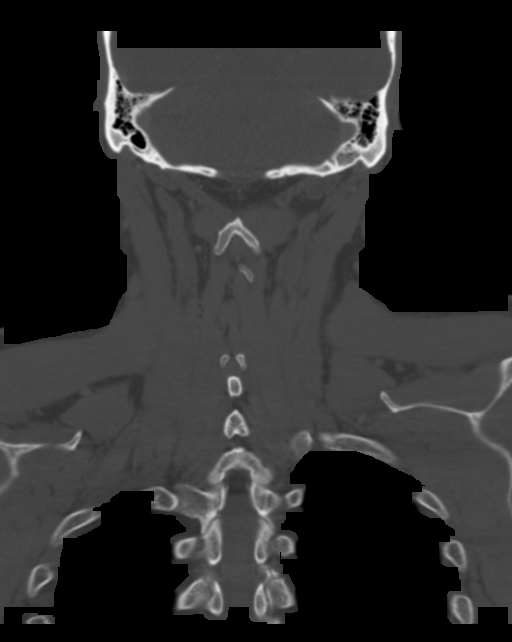

[Series 7: angled axial-oropharynx · axial · 0.39mm/px · z∈[-87,+10]mm · 2 of 152 slices shown]
[im 51/152  bone]
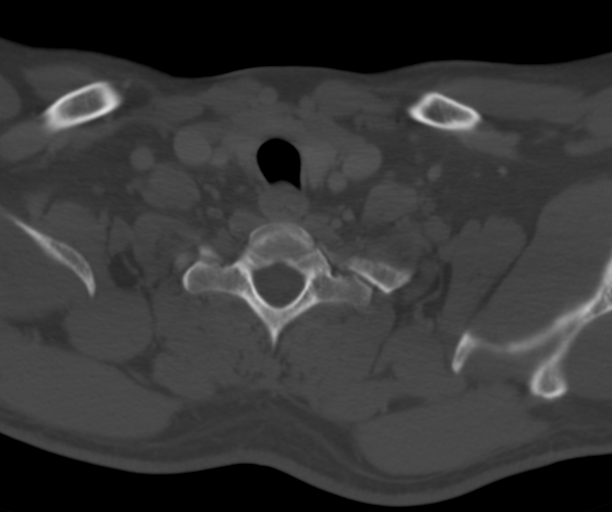
[im 101/152  bone]
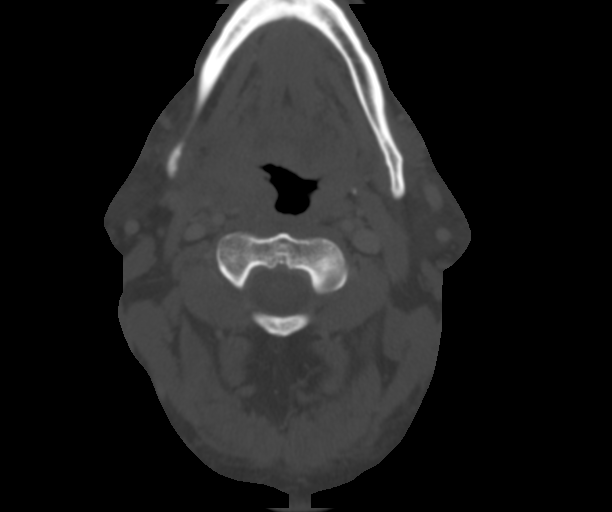

[Series 8: angled axial-dental · axial · 0.47mm/px · z∈[-97,+0]mm · 2 of 152 slices shown]
[im 51/152  bone]
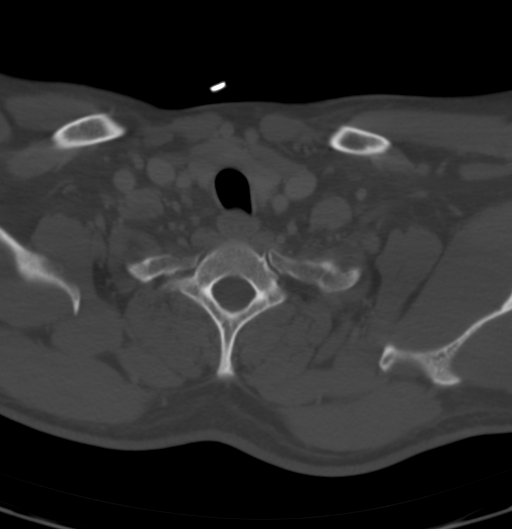
[im 101/152  bone]
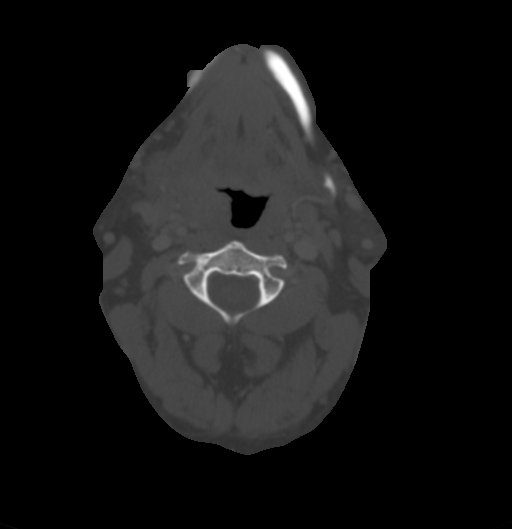

[15 of 33 positions shown; findings below may reference images not displayed]

FINDINGS: Pharynx and larynx: There is a large tonsillith in the RIGHT tonsil.
The RIGHT tonsil is inflamed. There is an 8 x 9 x 13 mm RIGHT
peritonsillar abscess, fairly well-defined, with inflammation in the
RIGHT parapharyngeal soft tissues. LEFT tonsillar region is
unremarkable. Adenoids unremarkable. Normal larynx.

Salivary glands: No parotid or LEFT submandibular inflammation,
mass, or stone. RIGHT submandibular gland is mildly inflamed due to
its proximity to the abscess.

Thyroid: Normal.

Lymph nodes: Reactive adenopathy on the RIGHT.

Vascular: Negative.

Limited intracranial: Negative.

Visualized orbits: Negative.

Mastoids and visualized paranasal sinuses: Clear.

Skeleton: No acute or aggressive process.  Spondylosis.

Upper chest: Negative.

Other: None.
IMPRESSION: RIGHT tonsillar inflammation with tonsillith, and associated RIGHT
peritonsillar abscess. The abscess measures 8 x 9 x 13 mm, with
associated inflammation.

These results will be called to the ordering clinician or
representative by the technologist at [HOSPITAL] 315.

## 2016-08-24 SURGERY — INCISION AND DRAINAGE, ABSCESS
Anesthesia: General | Site: Throat | Laterality: Right

## 2016-08-24 MED ORDER — ONDANSETRON HCL 4 MG/2ML IJ SOLN
INTRAMUSCULAR | Status: DC | PRN
  Administered 2016-08-24: 4 mg via INTRAVENOUS

## 2016-08-24 MED ORDER — SUCCINYLCHOLINE CHLORIDE 200 MG/10ML IV SOSY
PREFILLED_SYRINGE | INTRAVENOUS | Status: DC | PRN
  Administered 2016-08-24: 100 mg via INTRAVENOUS

## 2016-08-24 MED ORDER — MIDAZOLAM HCL 2 MG/2ML IJ SOLN
INTRAMUSCULAR | Status: AC
  Filled 2016-08-24: qty 2

## 2016-08-24 MED ORDER — LIDOCAINE 2% (20 MG/ML) 5 ML SYRINGE
INTRAMUSCULAR | Status: DC | PRN
  Administered 2016-08-24: 60 mg via INTRAVENOUS

## 2016-08-24 MED ORDER — SUGAMMADEX SODIUM 200 MG/2ML IV SOLN
INTRAVENOUS | Status: DC | PRN
  Administered 2016-08-24: 200 mg via INTRAVENOUS

## 2016-08-24 MED ORDER — LIDOCAINE-EPINEPHRINE 1 %-1:100000 IJ SOLN
INTRAMUSCULAR | Status: AC
  Filled 2016-08-24: qty 1

## 2016-08-24 MED ORDER — LACTATED RINGERS IV SOLN
INTRAVENOUS | Status: DC
  Administered 2016-08-24 (×2): via INTRAVENOUS

## 2016-08-24 MED ORDER — ROCURONIUM BROMIDE 100 MG/10ML IV SOLN
INTRAVENOUS | Status: DC | PRN
  Administered 2016-08-24: 30 mg via INTRAVENOUS

## 2016-08-24 MED ORDER — FENTANYL CITRATE (PF) 250 MCG/5ML IJ SOLN
INTRAMUSCULAR | Status: AC
  Filled 2016-08-24: qty 5

## 2016-08-24 MED ORDER — HYDROCODONE-ACETAMINOPHEN 5-325 MG PO TABS: 1.0000 | ORAL_TABLET | Freq: Four times a day (QID) | ORAL | 0 refills | Status: DC | PRN

## 2016-08-24 MED ORDER — PROPOFOL 10 MG/ML IV BOLUS
INTRAVENOUS | Status: AC
  Filled 2016-08-24: qty 20

## 2016-08-24 MED ORDER — 0.9 % SODIUM CHLORIDE (POUR BTL) OPTIME
TOPICAL | Status: DC | PRN
  Administered 2016-08-24: 1000 mL

## 2016-08-24 MED ORDER — DEXAMETHASONE SODIUM PHOSPHATE 10 MG/ML IJ SOLN
INTRAMUSCULAR | Status: DC | PRN
  Administered 2016-08-24: 10 mg via INTRAVENOUS

## 2016-08-24 MED ORDER — MIDAZOLAM HCL 2 MG/2ML IJ SOLN
INTRAMUSCULAR | Status: DC | PRN
  Administered 2016-08-24: 2 mg via INTRAVENOUS

## 2016-08-24 MED ORDER — IOPAMIDOL (ISOVUE-300) INJECTION 61%
75.0000 mL | Freq: Once | INTRAVENOUS | Status: AC | PRN
  Administered 2016-08-24: 75 mL via INTRAVENOUS

## 2016-08-24 MED ORDER — PROPOFOL 10 MG/ML IV BOLUS
INTRAVENOUS | Status: DC | PRN
  Administered 2016-08-24: 50 mg via INTRAVENOUS
  Administered 2016-08-24: 150 mg via INTRAVENOUS

## 2016-08-24 MED ORDER — FENTANYL CITRATE (PF) 100 MCG/2ML IJ SOLN
INTRAMUSCULAR | Status: DC | PRN
  Administered 2016-08-24: 100 ug via INTRAVENOUS
  Administered 2016-08-24: 50 ug via INTRAVENOUS

## 2016-08-24 MED ORDER — SUGAMMADEX SODIUM 200 MG/2ML IV SOLN
INTRAVENOUS | Status: AC
  Filled 2016-08-24: qty 2

## 2016-08-24 SURGICAL SUPPLY — 44 items
ATTRACTOMAT 16X20 MAGNETIC DRP (DRAPES) IMPLANT
BLADE SURG 15 STRL LF DISP TIS (BLADE) IMPLANT
BLADE SURG 15 STRL SS (BLADE)
CANISTER SUCT 3000ML PPV (MISCELLANEOUS) ×2 IMPLANT
CLEANER TIP ELECTROSURG 2X2 (MISCELLANEOUS) ×2 IMPLANT
CONT SPEC 4OZ CLIKSEAL STRL BL (MISCELLANEOUS) ×2 IMPLANT
COVER SURGICAL LIGHT HANDLE (MISCELLANEOUS) IMPLANT
CRADLE DONUT ADULT HEAD (MISCELLANEOUS) IMPLANT
DRAIN CHANNEL 15F RND FF W/TCR (WOUND CARE) IMPLANT
DRAIN PENROSE 1/2X12 LTX STRL (WOUND CARE) IMPLANT
DRAPE HALF SHEET 40X57 (DRAPES) IMPLANT
DRAPE INCISE 13X13 STRL (DRAPES) IMPLANT
ELECT COATED BLADE 2.86 ST (ELECTRODE) ×2 IMPLANT
ELECT REM PT RETURN 9FT ADLT (ELECTROSURGICAL) ×2
ELECTRODE REM PT RTRN 9FT ADLT (ELECTROSURGICAL) ×1 IMPLANT
EVACUATOR SILICONE 100CC (DRAIN) IMPLANT
GAUZE SPONGE 4X4 12PLY STRL (GAUZE/BANDAGES/DRESSINGS) ×2 IMPLANT
GAUZE SPONGE 4X4 16PLY XRAY LF (GAUZE/BANDAGES/DRESSINGS) ×2 IMPLANT
GLOVE SS BIOGEL STRL SZ 7.5 (GLOVE) ×1 IMPLANT
GLOVE SUPERSENSE BIOGEL SZ 7.5 (GLOVE) ×1
GOWN STRL REUS W/ TWL LRG LVL3 (GOWN DISPOSABLE) ×2 IMPLANT
GOWN STRL REUS W/TWL LRG LVL3 (GOWN DISPOSABLE) ×4
KIT BASIN OR (CUSTOM PROCEDURE TRAY) ×2 IMPLANT
KIT ROOM TURNOVER OR (KITS) ×2 IMPLANT
NDL HYPO 25GX1X1/2 BEV (NEEDLE) IMPLANT
NEEDLE HYPO 25GX1X1/2 BEV (NEEDLE) IMPLANT
NS IRRIG 1000ML POUR BTL (IV SOLUTION) ×2 IMPLANT
PAD ARMBOARD 7.5X6 YLW CONV (MISCELLANEOUS) ×4 IMPLANT
PENCIL FOOT CONTROL (ELECTRODE) ×2 IMPLANT
SOL PREP POV-IOD 4OZ 10% (MISCELLANEOUS) IMPLANT
SPONGE INTESTINAL PEANUT (DISPOSABLE) IMPLANT
STAPLER VISISTAT 35W (STAPLE) ×2 IMPLANT
SUCTION FRAZIER TIP 8 FR DISP (SUCTIONS)
SUCTION TUBE FRAZIER 8FR DISP (SUCTIONS) IMPLANT
SUT CHROMIC 4 0 PS 2 18 (SUTURE) IMPLANT
SUT ETHILON 3 0 PS 1 (SUTURE) IMPLANT
SUT ETHILON 5 0 P 3 18 (SUTURE)
SUT NYLON ETHILON 5-0 P-3 1X18 (SUTURE) IMPLANT
SUT SILK 2 0 FS (SUTURE) IMPLANT
TAPE CLOTH SURG 4X10 WHT LF (GAUZE/BANDAGES/DRESSINGS) IMPLANT
TOWEL OR 17X24 6PK STRL BLUE (TOWEL DISPOSABLE) ×2 IMPLANT
TRAY ENT MC OR (CUSTOM PROCEDURE TRAY) ×2 IMPLANT
WATER STERILE IRR 1000ML POUR (IV SOLUTION) ×2 IMPLANT
YANKAUER SUCT BULB TIP NO VENT (SUCTIONS) IMPLANT

## 2016-08-24 NOTE — Op Note (Signed)
Preop/postop diagnosis: Right peritonsillar abscess Procedure: Incision and drainage of right peritonsillar abscess Anesthesia: Gen. Estimated blood loss: Approximately 5 mL Indications: 51 year old who's had a CT scan that shows a right peritonsillar abscess. He's had symptoms for a few days. He's been on antibiotics. He was informed a risk and benefits as well as options of the procedure. All his questions are answered and consent was obtained.  Procedure: Patient was taken to the operating room placed in the supine position after general endotracheal tube anesthesia was placed in the Rose position draped in usual sterile manner. The Crowe-Davis mouth gag was inserted retracted and suspended from the Mayo stand. The right tonsil had a very large cryptic and a peritonsillar incision was made with electrocautery. It was dissected with the tonsil hemostat into the capsule and then opened inferiorly. There was a small amount of purulent material was expressed but that area seem to be connected to the large cryptic that was in the tonsil itself. A large amount of cryptic material came out of that site. The wound was irrigated with saline. There was good hemostasis. The Crowe-Davis was removed. The patient was awakened brought to recovery in stable condition counts correct

## 2016-08-24 NOTE — Anesthesia Procedure Notes (Signed)
Procedure Name: Intubation Date/Time: 08/24/2016 5:04 PM Performed by: Trixie Deis A Pre-anesthesia Checklist: Patient identified, Emergency Drugs available, Suction available and Patient being monitored Patient Re-evaluated:Patient Re-evaluated prior to inductionOxygen Delivery Method: Circle System Utilized Preoxygenation: Pre-oxygenation with 100% oxygen Intubation Type: IV induction Ventilation: Mask ventilation without difficulty Laryngoscope Size: Mac and 4 Grade View: Grade II Tube type: Oral Tube size: 7.5 mm Number of attempts: 1 Airway Equipment and Method: Stylet and Oral airway Placement Confirmation: ETT inserted through vocal cords under direct vision,  positive ETCO2 and breath sounds checked- equal and bilateral Secured at: 23 cm Tube secured with: Tape Dental Injury: Teeth and Oropharynx as per pre-operative assessment

## 2016-08-24 NOTE — Transfer of Care (Deleted)
Immediate Anesthesia Transfer of Care Note  Patient: George PulseDonald R [name]  Procedure(s) Performed: Procedure(s) with comments: INCISION AND DRAINAGE ABSCESS (Right) - Incision and drainage right Peritonsillar abcess   Patient Location: PACU  Anesthesia Type:General  Level of Consciousness: awake, alert  and oriented  Airway & Oxygen Therapy: Patient Spontanous Breathing and Patient connected to nasal cannula oxygen  Post-op Assessment: Report given to RN and Post -op Vital signs reviewed and stable  Post vital signs: Reviewed and stable  Last Vitals:  Vitals:   08/24/16 1549 08/24/16 1756  BP: (!) 151/89   [name]: (!) 108   Resp: 18   Temp: 36.9 C (P) 36.2 C    Last Pain:  Vitals:   08/24/16 1549  TempSrc: Oral         Complications: No apparent anesthesia complications

## 2016-08-24 NOTE — H&P (Signed)
Brigitte PulseDonald R Tusing is an 51 y.o. male.   Chief Complaint: sore throat HPI: hx of right throat pain and CT with PTA  Past Medical History:  Diagnosis Date  . Anxiety   . Depression     Past Surgical History:  Procedure Laterality Date  . SHOULDER ARTHROSCOPY    . VASECTOMY    . wrist shoulder      History reviewed. No pertinent family history. Social History:  reports that he has quit smoking. He has never used smokeless tobacco. He reports that he drinks alcohol. He reports that he does not use drugs.  Allergies:  Allergies  Allergen Reactions  . Relafen [Nabumetone] Rash    Medications Prior to Admission  Medication Sig Dispense Refill  . clindamycin (CLEOCIN) 300 MG capsule Take 1 capsule (300 mg total) by mouth 3 (three) times daily. 21 capsule 0  . ibuprofen (ADVIL,MOTRIN) 600 MG tablet Take 1 tablet (600 mg total) by mouth every 6 (six) hours as needed. 30 tablet 0  . magic mouthwash w/lidocaine SOLN Take 10 mLs by mouth 4 (four) times daily as needed (throat pain). Note to pharmacy - equal parts diphendydramine, aluminum hydroxide and lidocaine HCL 120 mL 0    No results found for this or any previous visit (from the past 48 hour(s)). Ct Soft Tissue Neck W Contrast  Result Date: 08/24/2016 CLINICAL DATA:  RIGHT tonsillar inflammation for 4 days. Throat pain. EXAM: CT NECK WITH CONTRAST TECHNIQUE: Multidetector CT imaging of the neck was performed using the standard protocol following the bolus administration of intravenous contrast. CONTRAST:  75mL ISOVUE-300 IOPAMIDOL (ISOVUE-300) INJECTION 61% COMPARISON:  None. FINDINGS: Pharynx and larynx: There is a large tonsillith in the RIGHT tonsil. The RIGHT tonsil is inflamed. There is an 8 x 9 x 13 mm RIGHT peritonsillar abscess, fairly well-defined, with inflammation in the RIGHT parapharyngeal soft tissues. LEFT tonsillar region is unremarkable. Adenoids unremarkable. Normal larynx. Salivary glands: No parotid or LEFT  submandibular inflammation, mass, or stone. RIGHT submandibular gland is mildly inflamed due to its proximity to the abscess. Thyroid: Normal. Lymph nodes: Reactive adenopathy on the RIGHT. Vascular: Negative. Limited intracranial: Negative. Visualized orbits: Negative. Mastoids and visualized paranasal sinuses: Clear. Skeleton: No acute or aggressive process.  Spondylosis. Upper chest: Negative. Other: None. IMPRESSION: RIGHT tonsillar inflammation with tonsillith, and associated RIGHT peritonsillar abscess. The abscess measures 8 x 9 x 13 mm, with associated inflammation. These results will be called to the ordering clinician or representative by the technologist [facility]. Electronically Signed   By: Elsie StainJohn T Curnes M.D.   On: 08/24/2016 14:56    ROS  Blood pressure (!) 151/89, pulse (!) 108, temperature 98.4 F (36.9 C), temperature source Oral, resp. rate 18, height 5\' 8"  (1.727 m), weight 89.4 kg (197 lb), SpO2 98 %. Physical Exam  Constitutional: He appears well-developed and well-nourished.  HENT:  Head: Normocephalic.  Right Ear: External ear normal.  Left Ear: External ear normal.  Nose: Nose normal.  Right tonsil swelling. No trismus. Soft palate normal  Eyes: Pupils are equal, round, and reactive to light.  Neck: Normal range of motion. Neck supple.  Cardiovascular: Normal rate.   Respiratory: Effort normal.  GI: Soft.  Musculoskeletal: Normal range of motion.     Assessment/Plan Right PTA- discussed I/D and ready to proceed  Suzanna ObeyBYERS, Sabryna Lahm, MD 08/24/2016, 4:47 PM

## 2016-08-24 NOTE — Transfer of Care (Signed)
Immediate Anesthesia Transfer of Care Note  Patient: George PulseDonald R [name]  Procedure(s) Performed: Procedure(s) with comments: INCISION AND DRAINAGE ABSCESS (Right) - Incision and drainage right Peritonsillar abcess   Patient Location: PACU  Anesthesia Type:General  Level of Consciousness: awake, alert  and oriented  Airway & Oxygen Therapy: Patient Spontanous Breathing and Patient connected to nasal cannula oxygen  Post-op Assessment: Report given to RN and Post -op Vital signs reviewed and stable  Post vital signs: Reviewed and stable  Last Vitals:  Vitals:   08/24/16 1549  BP: (!) 151/89  [name]: (!) 108  Resp: 18  Temp: 36.9 C    Last Pain:  Vitals:   08/24/16 1549  TempSrc: Oral         Complications: No apparent anesthesia complications

## 2016-08-24 NOTE — Anesthesia Preprocedure Evaluation (Addendum)
Anesthesia Evaluation  Patient identified by MRN, date of birth, ID band Patient awake    Reviewed: Allergy & Precautions, NPO status , Patient's Chart, lab work & pertinent test results  Airway Mallampati: II  TM Distance: >3 FB Neck ROM: Full  Mouth opening: Limited Mouth Opening  Dental  (+) Teeth Intact, Dental Advisory Given   Pulmonary former smoker,    breath sounds clear to auscultation       Cardiovascular negative cardio ROS   Rhythm:Regular Rate:Normal     Neuro/Psych PSYCHIATRIC DISORDERS Anxiety Depression negative neurological ROS     GI/Hepatic negative GI ROS, Neg liver ROS,   Endo/Other  negative endocrine ROS  Renal/GU negative Renal ROS  negative genitourinary   Musculoskeletal negative musculoskeletal ROS (+)   Abdominal   Peds negative pediatric ROS (+)  Hematology negative hematology ROS (+)   Anesthesia Other Findings   Reproductive/Obstetrics negative OB ROS                           Anesthesia Physical Anesthesia Plan  ASA: II and emergent  Anesthesia Plan: General   Post-op Pain Management:    Induction: Intravenous, Rapid sequence and Cricoid pressure planned  PONV Risk Score and Plan: 3 and Ondansetron, Dexamethasone and Scopolamine patch - Pre-op  Airway Management Planned: Oral ETT  Additional Equipment:   Intra-op Plan:   Post-operative Plan: Extubation in OR  Informed Consent: I have reviewed the patients History and Physical, chart, labs and discussed the procedure including the risks, benefits and alternatives for the proposed anesthesia with the patient or authorized representative who has indicated his/her understanding and acceptance.   Dental advisory given  Plan Discussed with: Anesthesiologist and CRNA  Anesthesia Plan Comments:        Anesthesia Quick Evaluation

## 2016-08-25 ENCOUNTER — Encounter (HOSPITAL_COMMUNITY): Payer: Self-pay | Admitting: Otolaryngology

## 2016-08-25 NOTE — Anesthesia Postprocedure Evaluation (Signed)
Anesthesia Post Note  Patient: George [name]  Procedure(s) Performed: Procedure(s) (LRB): INCISION AND DRAINAGE ABSCESS (Right)     Patient location during evaluation: PACU Anesthesia Type: General Level of consciousness: awake and alert Pain management: pain level controlled Vital Signs Assessment: post-procedure vital signs reviewed and stable Respiratory status: spontaneous breathing, nonlabored ventilation, respiratory function stable and patient connected to nasal cannula oxygen Cardiovascular status: blood pressure returned to baseline and stable Postop Assessment: no signs of nausea or vomiting Anesthetic complications: no    Last Vitals:  Vitals:   08/24/16 1812 08/24/16 1815  BP: 123/76   Pulse: 100   Resp: 15   Temp:  36.6 C    Last Pain:  Vitals:   08/24/16 1756  TempSrc:   PainSc: Asleep                 Jayron Maqueda,W. EDMOND

## 2016-08-26 NOTE — ED Provider Notes (Signed)
[facility]-EMERGENCY DEPT Provider Note   CSN: [id number] Arrival date & time: 08/22/16  1957     History   Chief Complaint Chief Complaint  Patient presents with  . Sore Throat    HPI George [name] is a 51 y.o. male presenting with a 24 hour history of right tonsil pain and swelling.  He denies fevers, chills, nasal congestion, drainage, pnd, fevers or chills but reports significant pain with swallowing.  He was seen by an urgent care and sent here for further evaluation.  He denies cough, wheezing or shortness of breath.  He has found no alleviators for his symptoms.  HPI  Past Medical History:  Diagnosis Date  . Anxiety   . Depression     There are no active problems to display for this patient.   Past Surgical History:  Procedure Laterality Date  . INCISION AND DRAINAGE ABSCESS Right 08/24/2016   Procedure: INCISION AND DRAINAGE ABSCESS;  Surgeon: Suzanna ObeyByers, John, MD;  [facility];  Service: ENT;  Laterality: Right;  Incision and drainage right Peritonsillar abcess   . SHOULDER ARTHROSCOPY    . VASECTOMY    . wrist shoulder         Home Medications    Prior to Admission medications   Medication Sig Start Date End Date Taking? Authorizing Provider  clindamycin (CLEOCIN) 300 MG capsule Take 1 capsule (300 mg total) by mouth 3 (three) times daily. 08/22/16   Burgess AmorIdol, Lecil Tapp, PA-C  HYDROcodone-acetaminophen (LORTAB) 5-325 MG tablet Take 1 tablet by mouth every 6 (six) hours as needed for moderate pain. 08/24/16   Suzanna ObeyByers, John, MD  ibuprofen (ADVIL,MOTRIN) 600 MG tablet Take 1 tablet (600 mg total) by mouth every 6 (six) hours as needed. 08/22/16   Burgess AmorIdol, Mliss Wedin, PA-C  magic mouthwash w/lidocaine SOLN Take 10 mLs by mouth 4 (four) times daily as needed (throat pain). Note to pharmacy - equal parts diphendydramine, aluminum hydroxide and lidocaine HCL 08/22/16   Burgess AmorIdol, Honor Frison, PA-C    Family History History reviewed. No pertinent family history.  Social History Social History    Substance Use Topics  . Smoking status: Former Games developermoker  . Smokeless tobacco: Never Used  . Alcohol use Yes     Comment: occasionally     Allergies   Relafen [nabumetone]   Review of Systems Review of Systems  Constitutional: Negative for chills and fever.  HENT: Positive for sore throat. Negative for congestion, ear pain, rhinorrhea, sinus pressure, trouble swallowing and voice change.   Eyes: Negative for discharge.  Respiratory: Negative for cough, shortness of breath, wheezing and stridor.   Cardiovascular: Negative for chest pain.  Gastrointestinal: Negative.   Genitourinary: Negative.   Skin: Negative.      Physical Exam Updated Vital Signs BP 138/86 (BP Location: Right Arm)   Pulse (!) 101   Temp 98.3 F (36.8 C) (Oral)   Resp 16   Ht 5\' 8"  (1.727 m)   Wt 89.4 kg (197 lb)   SpO2 98%   BMI 29.95 kg/m   Physical Exam  Constitutional: He is oriented to person, place, and time. He appears well-developed and well-nourished.  HENT:  Head: Normocephalic and atraumatic.  Right Ear: Tympanic membrane and ear canal normal.  Left Ear: Tympanic membrane and ear canal normal.  Nose: No mucosal edema or rhinorrhea.  Mouth/Throat: Uvula is midline and mucous membranes are normal. No uvula swelling. Posterior oropharyngeal erythema present. No oropharyngeal exudate, posterior oropharyngeal edema or tonsillar abscesses. Tonsils are 2+ on  the right. Tonsils are 1+ on the left. No tonsillar exudate.  Right tonsillar hypertrophy, erythema without exudate.  No soft tissue edema suggesting peritonsillar abscess. Uvula midline. Phonation without hot potato quality.  Eyes: Conjunctivae are normal.  Cardiovascular: Normal rate and normal heart sounds.   Pulmonary/Chest: Effort normal. No stridor. No respiratory distress. He has no decreased breath sounds. He has no wheezes.  Abdominal: Soft. There is no tenderness.  Musculoskeletal: Normal range of motion.  Neurological: He is alert  and oriented to person, place, and time.  Skin: Skin is warm and dry. No rash noted.  Psychiatric: He has a normal mood and affect.     ED Treatments / Results  Labs (all labs ordered are listed, but only abnormal results are displayed) Labs Reviewed - No data to display  EKG  EKG Interpretation None       Radiology   Procedures Procedures (including critical care time)  Medications Ordered in ED Medications  clindamycin (CLEOCIN) capsule 300 mg (300 mg Oral Given 08/22/16 2120)  ibuprofen (ADVIL,MOTRIN) tablet 800 mg (800 mg Oral Given 08/22/16 2120)  lidocaine (XYLOCAINE) 2 % viscous mouth solution 15 mL (15 mLs Mouth/Throat Given 08/22/16 2135)     Initial Impression / Assessment and Plan / ED Course  I have reviewed the triage vital signs and the nursing notes.  Pertinent labs & imaging results that were available during my care of the patient were reviewed by me and considered in my medical decision making (see chart for details).     Discussed with Dr. Lynelle Doctor who also saw patient during this visit.  No exam findings to suggest abscess at this time.  Pt was started on clindamycin, advised ibuprofen and magic mouthwash for comfort.  F/u with ent, referrals given, plan recheck here or by pcp for any persistent or worsening sx in the interim.  Final Clinical Impressions(s) / ED Diagnoses   Final diagnoses:  Peritonsillar cellulitis    New Prescriptions Discharge Medication List as of 08/22/2016  9:24 PM    START taking these medications   Details  clindamycin (CLEOCIN) 300 MG capsule Take 1 capsule (300 mg total) by mouth 3 (three) times daily., Starting Mon 08/22/2016, Print    ibuprofen (ADVIL,MOTRIN) 600 MG tablet Take 1 tablet (600 mg total) by mouth every 6 (six) hours as needed., Starting Mon 08/22/2016, Print    magic mouthwash w/lidocaine SOLN Take 10 mLs by mouth 4 (four) times daily as needed (throat pain). Note to pharmacy - equal parts diphendydramine,  aluminum hydroxide and lidocaine HCL, Starting Mon 08/22/2016, Print         Victoriano Lain 08/26/16 1123    Linwood Dibbles, MD 08/28/16 [phone]

## 2019-07-19 ENCOUNTER — Other Ambulatory Visit: Payer: Self-pay | Admitting: Gastroenterology

## 2019-07-19 DIAGNOSIS — R7989 Other specified abnormal findings of blood chemistry: Secondary | ICD-10-CM

## 2019-08-07 ENCOUNTER — Ambulatory Visit: Payer: 59 | Admitting: Pulmonary Disease

## 2019-08-07 ENCOUNTER — Encounter: Payer: Self-pay | Admitting: Pulmonary Disease

## 2019-08-07 ENCOUNTER — Other Ambulatory Visit: Payer: Self-pay

## 2019-08-07 VITALS — BP 130/86 | HR 87 | Temp 98.2°F | Ht 68.0 in | Wt 193.0 lb

## 2019-08-07 DIAGNOSIS — R0683 Snoring: Secondary | ICD-10-CM

## 2019-08-07 NOTE — Progress Notes (Signed)
George [name], George [name], George [name]  Chief Complaint  Patient presents with  . Sleep Consult    Referred by George Lundborg, PA- c/o snoring. Epworth score = 13.     Constitutional:  BP 130/86 (BP Location: Left Arm, Cuff Size: Normal)   Pulse 87   Temp 98.2 F (36.8 C) (Temporal)   Ht 5\' 8"  (1.727 m)   Wt 193 lb (87.5 kg)   SpO2 97% Comment: on RA  BMI 29.35 kg/m   Past Medical History:  Anxiety, Depression  Summary:  George [name] is a 54 y.o. male with snoring.  Subjective:  His family has been concerned about his snoring.  This has been present for years George getting worse.  He has been told he stops breathing at night.  He will wake up feeling like his breathing stopped George his heart racing.  He will have to take naps during his break at work.    He goes to sleep at 9 pm.  He falls asleep quickly.  He wakes up after about 4 hours George sometimes has trouble falling back to sleep.  He gets out of bed at 515 am.  He feels okay in the morning.  He denies morning headache.  He does not use anything to help him fall sleep.  He drinks a cup of coffee in the morning.  He denies sleep walking, sleep talking, bruxism, or nightmares.  There is no history of restless legs.  He denies sleep hallucinations, sleep paralysis, or cataplexy.  The Epworth score is 13 out of 24.  Physical Exam:   Appearance - well kempt  ENMT - no sinus tenderness, no nasal discharge, no oral exudate, Mallampati 4  Respiratory - no wheeze, or rales  CV - regular rate George rhythm, no murmurs  GI - soft, non tender  Lymph - no adenopathy noted in neck  Ext - no edema  Skin - no rashes  Neuro - normal strength, oriented x 3  Psych - normal mood George affect  Discussion:  He has snoring, sleep disruption, apnea, George daytime sleepiness.  He has history of anxiety George depression.  I am concerned he could have obstructive sleep apnea.  Assessment/Plan:   Snoring with excessive  daytime sleepiness. - will need to arrange for a home sleep study  Obesity. - discussed how weight can impact sleep George risk for sleep disordered breathing - discussed options to assist with weight loss: combination of diet modification, cardiovascular George strength training exercises  Cardiovascular risk. - had an extensive discussion regarding the adverse health consequences related to untreated sleep disordered breathing - specifically discussed the risks for hypertension, coronary artery disease, cardiac dysrhythmias, cerebrovascular disease, George diabetes - lifestyle modification discussed  Safe driving practices. - discussed how sleep disruption can increase risk of accidents, particularly when driving - safe driving practices were discussed  Therapies for obstructive sleep apnea. - if the sleep study shows significant sleep apnea, then various therapies for treatment were reviewed: CPAP, oral appliance, George surgical interventions  A total of 32 minutes addressing patient [name] on the day of the visit.  Follow up:  Patient Instructions  Will arrange for home sleep study Will call to arrange for follow up after sleep study reviewed   Signature:  Chesley Mires, MD [place] Pager: [phone] 08/07/2019, 9:32 AM  Flow Sheet    Sleep tests:    Medications:   Allergies as of 08/07/2019      Reactions  Relafen [nabumetone] Rash      Medication List       Accurate as of August 07, 2019  9:32 AM. If you have any questions, ask your nurse or doctor.        STOP taking these medications   clindamycin 300 MG capsule Commonly known as: CLEOCIN Stopped by: Coralyn Helling, MD   HYDROcodone-acetaminophen 5-325 MG tablet Commonly known as: Lortab Stopped by: Coralyn Helling, MD   ibuprofen 600 MG tablet Commonly known as: ADVIL Stopped by: Coralyn Helling, MD   magic mouthwash w/lidocaine Soln Stopped by: Coralyn Helling, MD       Past Surgical History:    He  has a past surgical history that includes wrist shoulder; Shoulder arthroscopy; Vasectomy; George Incision George drainage abscess (Right, 08/24/2016).  Family History:  His family history includes Breast cancer in his mother; COPD in his father.  Social History:  He  reports that he quit smoking about 21 years ago. His smoking use included cigarettes. He has a 15.00 pack-year smoking history. He has never used smokeless tobacco. He reports current alcohol use. He reports that he does not use drugs.

## 2019-08-07 NOTE — Patient Instructions (Signed)
Will arrange for home sleep study Will call to arrange for follow up after sleep study reviewed  

## 2019-08-09 ENCOUNTER — Ambulatory Visit
Admission: RE | Admit: 2019-08-09 | Discharge: 2019-08-09 | Disposition: A | Discharge status: Home / Self Care | Payer: BC Managed Care – PPO | Source: Ambulatory Visit | Attending: Gastroenterology | Admitting: Gastroenterology

## 2019-08-09 DIAGNOSIS — R7989 Other specified abnormal findings of blood chemistry: Secondary | ICD-10-CM

## 2019-08-09 IMAGING — US US ABDOMEN LIMITED
1 series · 14 of 25 positions shown · non-contrast
Comparison: None.

CLINICAL DATA: Elevated LFTs

EXAM:
ULTRASOUND ABDOMEN LIMITED RIGHT UPPER QUADRANT

[Series 1: us abdomen limited · 0.28mm/px · 14 of 43 slices shown]
[im 1/43]
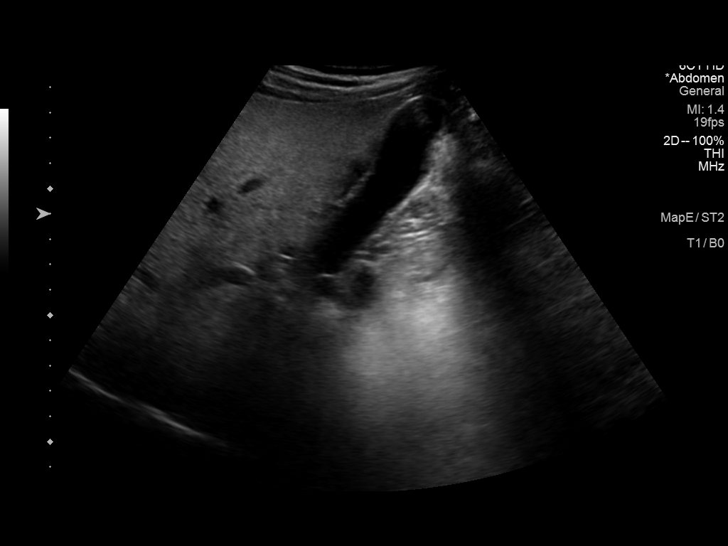
[im 4/43]
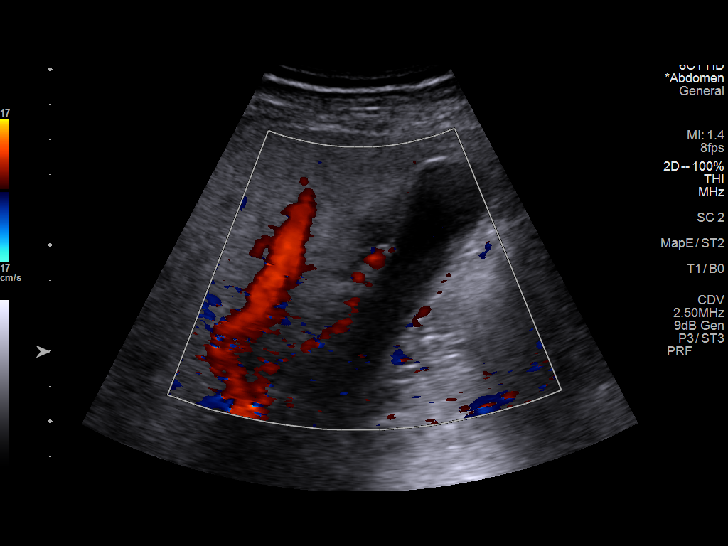
[im 8/43]
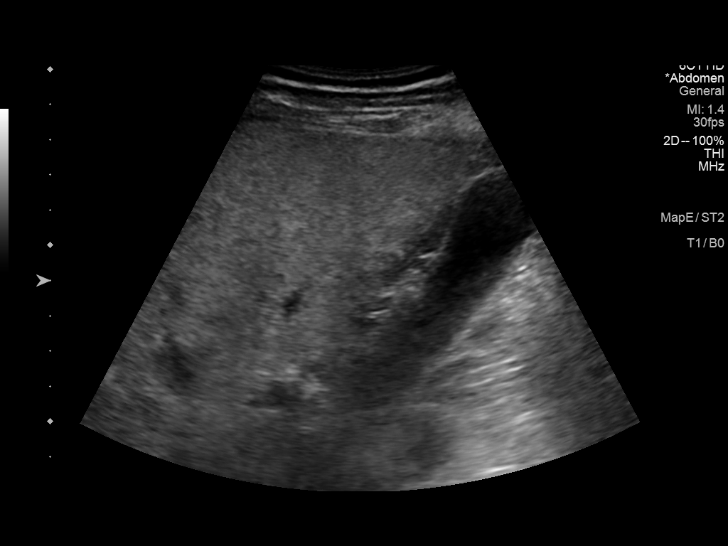
[im 11/43]
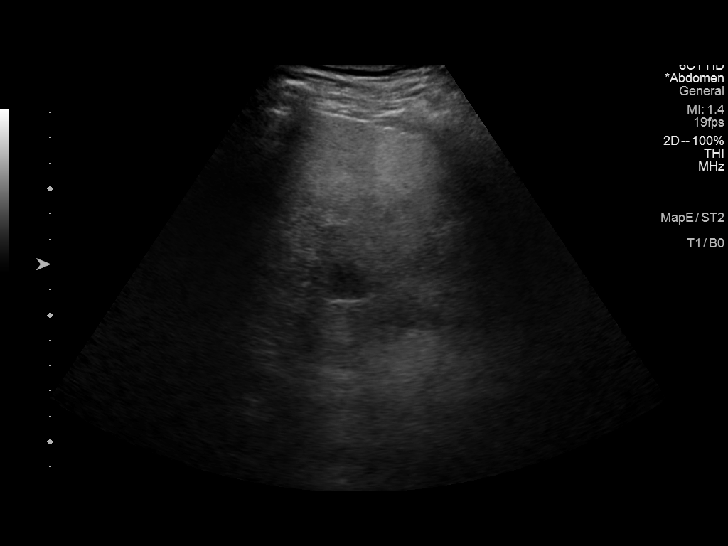
[im 15/43]
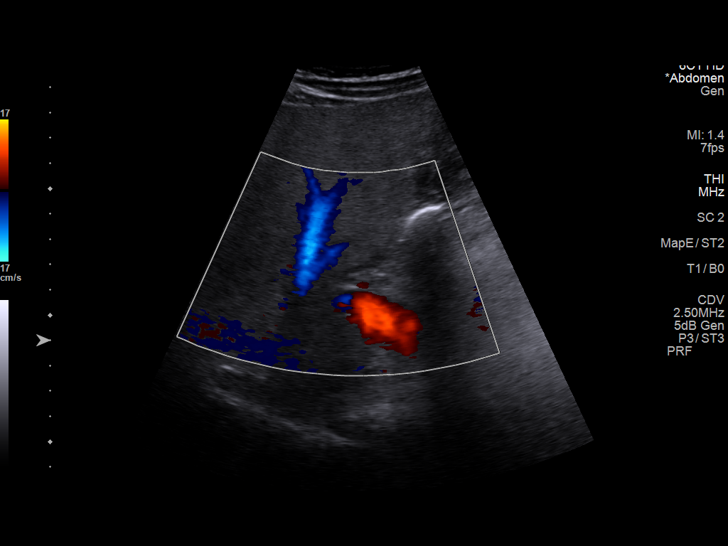
[im 16/43]
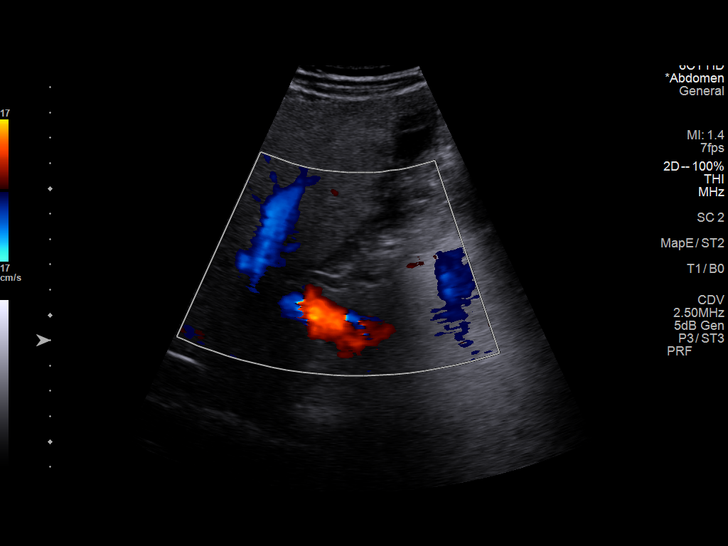
[im 20/43]
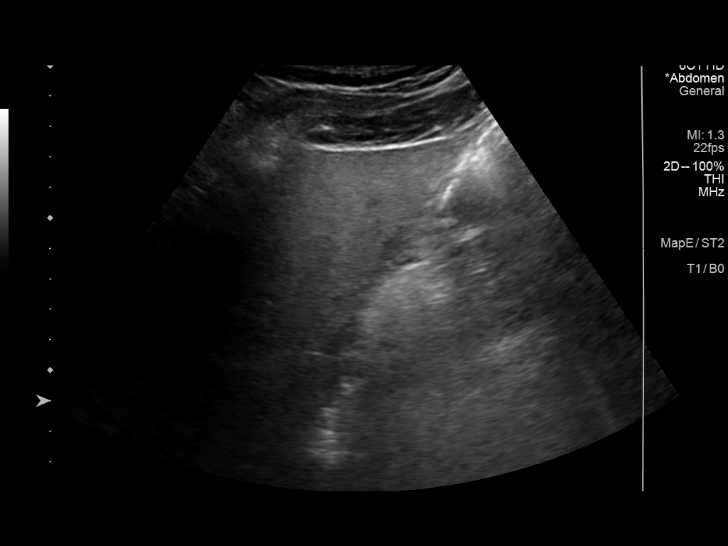
[im 23/43]
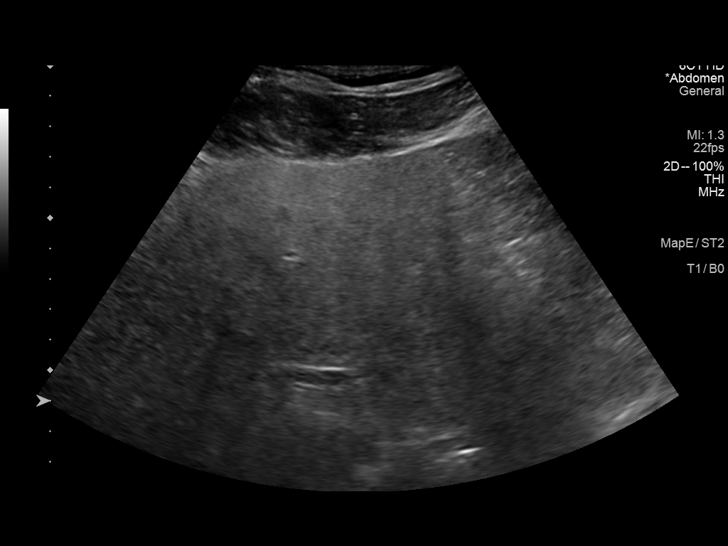
[im 27/43]
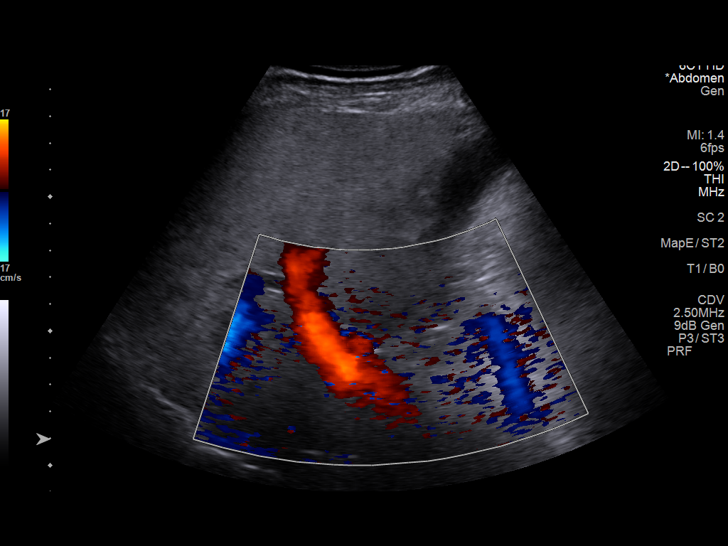
[im 29/43]
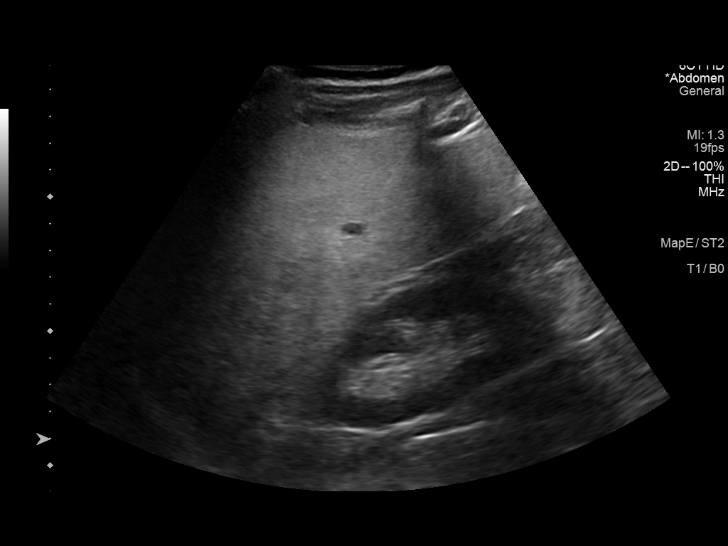
[im 32/43]
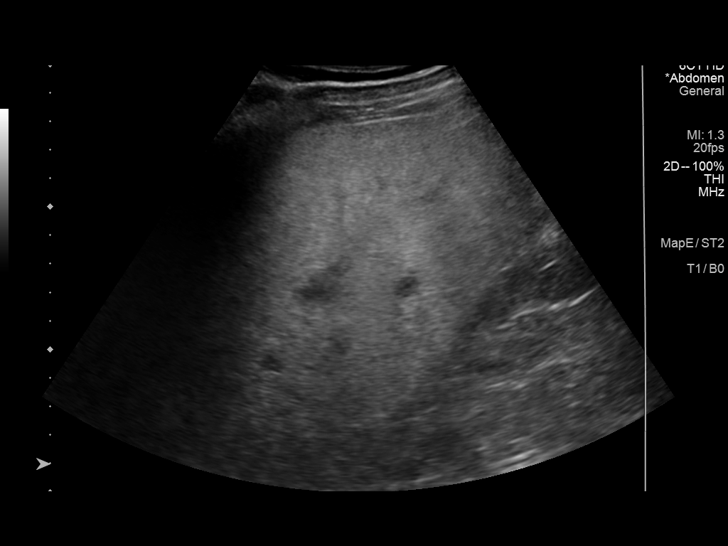
[im 36/43]
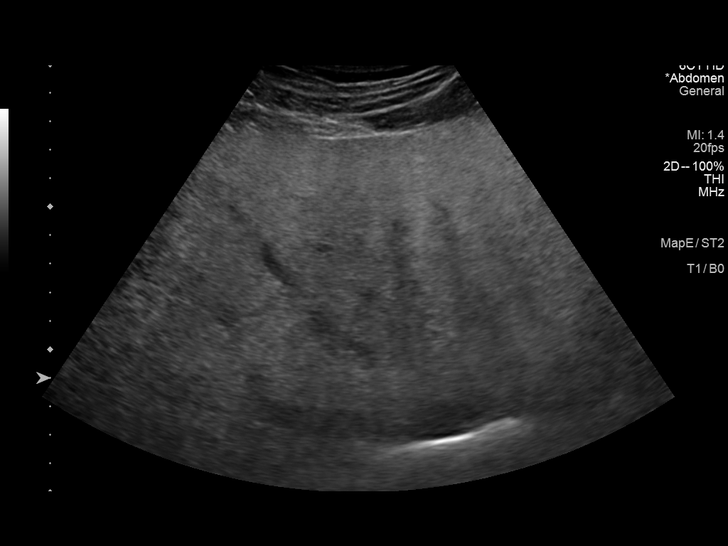
[im 39/43]
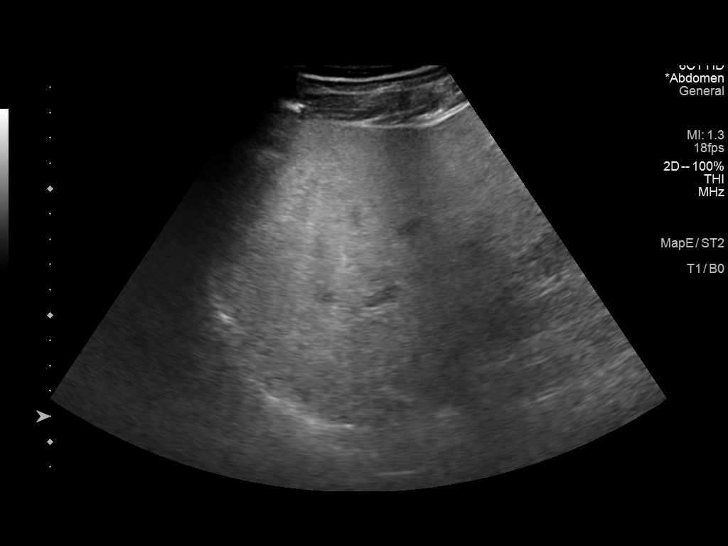
[im 43/43]
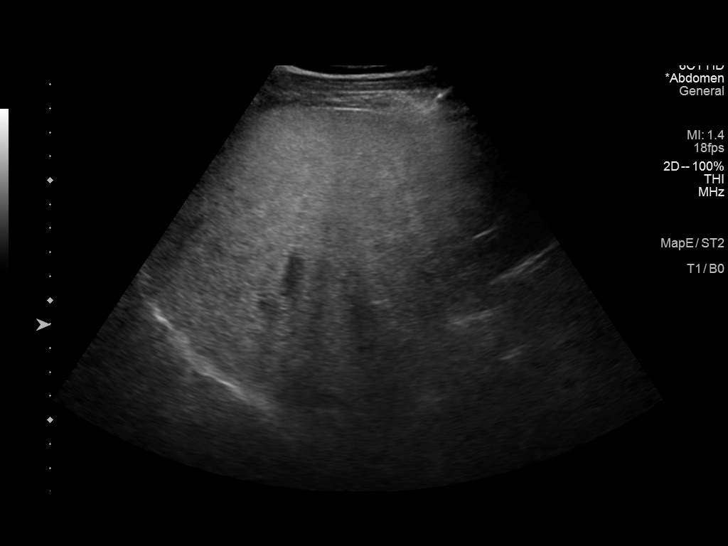

[14 of 25 positions shown; findings below may reference images not displayed]

FINDINGS: Gallbladder:

5 mm echogenic non mobile nonshadowing focus along the anterior
gallbladder wall compatible with small polyp. No stones or wall
thickening.

Common bile duct:

Diameter: Normal caliber, 4 mm

Liver:

Increased echotexture throughout the liver compatible with fatty
infiltration. Focal fatty sparing adjacent to the gallbladder fossa.
No focal abnormality. Portal vein is patent on color Doppler imaging
with normal direction of blood flow towards the liver.

Other: None.
IMPRESSION: Fatty infiltration of the liver.

Small gallbladder wall polyp.

## 2019-09-13 ENCOUNTER — Ambulatory Visit: Payer: BC Managed Care – PPO

## 2019-09-13 ENCOUNTER — Other Ambulatory Visit: Payer: Self-pay

## 2019-09-13 DIAGNOSIS — R0683 Snoring: Secondary | ICD-10-CM

## 2019-09-13 DIAGNOSIS — G4733 Obstructive sleep apnea (adult) (pediatric): Secondary | ICD-10-CM | POA: Diagnosis not present

## 2019-09-17 ENCOUNTER — Telehealth: Payer: Self-pay | Admitting: Pulmonary Disease

## 2019-09-17 DIAGNOSIS — G4733 Obstructive sleep apnea (adult) (pediatric): Secondary | ICD-10-CM | POA: Diagnosis not present

## 2019-09-17 NOTE — Telephone Encounter (Signed)
HST 09/14/19 >> AHI 10.7, SpO2 low 67%.   Please inform him that his sleep study shows mild obstructive sleep apnea.  Please arrange for ROV with me or NP to discuss treatment options.

## 2019-09-17 NOTE — Telephone Encounter (Signed)
Patient contacted with HST results, follow up appointment made with nurse practitioner at first available appointment, patient can only come on Fridays.

## 2019-10-04 ENCOUNTER — Ambulatory Visit: Payer: BC Managed Care – PPO | Admitting: Adult Health

## 2020-09-29 ENCOUNTER — Ambulatory Visit: Payer: BC Managed Care – PPO | Admitting: General Surgery

## 2020-09-29 ENCOUNTER — Other Ambulatory Visit: Payer: Self-pay

## 2020-09-29 ENCOUNTER — Other Ambulatory Visit: Payer: Self-pay | Admitting: Family Medicine

## 2020-09-29 ENCOUNTER — Encounter: Payer: Self-pay | Admitting: General Surgery

## 2020-09-29 VITALS — BP 141/86 | HR 83 | Temp 98.4°F | Resp 16 | Ht 68.0 in | Wt 198.0 lb

## 2020-09-29 DIAGNOSIS — K4090 Unilateral inguinal hernia, without obstruction or gangrene, not specified as recurrent: Secondary | ICD-10-CM

## 2020-09-29 NOTE — Progress Notes (Signed)
George [name]; [id number]; 06-15-1965   HPI Patient is a 55 year old white male who was referred to my care by Rueben Bash for evaluation and treatment of a right inguinal hernia.  Patient has had pain in the right groin region and in the right testicle for over a month.  He was treated for epididymitis.  He was seen by a urologist in [place] who found that he had a right inguinal hernia.  He continues to have intermittent swelling of the right testicle.  It is made worse with straining. Past Medical History:  Diagnosis Date   Anxiety    Depression     Past Surgical History:  Procedure Laterality Date   INCISION AND DRAINAGE ABSCESS Right 08/24/2016   Procedure: INCISION AND DRAINAGE ABSCESS;  Surgeon: Suzanna Obey, MD;  Location: [facility] OR;  Service: ENT;  Laterality: Right;  Incision and drainage right Peritonsillar abcess    SHOULDER ARTHROSCOPY     VASECTOMY     wrist shoulder      Family History  Problem Relation Age of Onset   Breast cancer Mother    COPD Father     No current outpatient medications on file prior to visit.   No current facility-administered medications on file prior to visit.    Allergies  Allergen Reactions   Relafen [Nabumetone] Rash    Social History   Substance and Sexual Activity  Alcohol Use Yes   Comment: occasionally    Social History   Tobacco Use  Smoking Status Former   Packs/day: 1.00   Years: 15.00   Pack years: 15.00   Types: Cigarettes   Quit date: 02/28/1998   Years since quitting: 22.6  Smokeless Tobacco Never    Review of Systems  Constitutional: Negative.   HENT: Negative.    Eyes: Negative.   Respiratory: Negative.    Cardiovascular: Negative.   Gastrointestinal: Negative.   Genitourinary: Negative.   Musculoskeletal: Negative.   Skin: Negative.   Neurological: Negative.   Endo/Heme/Allergies: Negative.   Psychiatric/Behavioral: Negative.     Objective   Vitals:   09/29/20 0850  BP: (!) 141/86   Pulse: 83  Resp: 16  Temp: 98.4 F (36.9 C)  SpO2: 96%    Physical Exam Vitals reviewed.  Constitutional:      Appearance: Normal appearance. He is not ill-appearing.  HENT:     Head: Normocephalic and atraumatic.  Cardiovascular:     Rate and Rhythm: Normal rate and regular rhythm.     Heart sounds: Normal heart sounds. No murmur heard.   No friction rub. No gallop.  Pulmonary:     Effort: Pulmonary effort is normal. No respiratory distress.     Breath sounds: Normal breath sounds. No stridor. No wheezing, rhonchi or rales.  Abdominal:     General: Bowel sounds are normal. There is no distension.     Palpations: Abdomen is soft. There is no mass.     Tenderness: There is no abdominal tenderness. There is no guarding or rebound.     Hernia: A hernia is present.     Comments: A small radial hernia which is reducible is noted.  A weak left inguinal floor is also noted.  Genitourinary:    Comments: No testicular swelling is present.  He does seem to have tenderness at the epididymis. Skin:    General: Skin is warm and dry.  Neurological:     Mental Status: He is alert and oriented to person, place, and time.  Ultrasound  of testicles was negative for hydrocele or focal lesions. Primary care notes reviewed Assessment  Right inguinal hernia Plan  Patient is scheduled for right inguinal herniorrhaphy with mesh on 10/05/2020.  The risks and benefits of the procedure including bleeding, infection, mesh use, and the possibly recurrence of the hernia were fully explained to the patient, who gave informed consent. 

## 2020-09-29 NOTE — H&P (Signed)
George [name]; [id number]; 06-15-1965   HPI Patient is a 55 year old white male who was referred to my care by Rueben Bash for evaluation and treatment of a right inguinal hernia.  Patient has had pain in the right groin region and in the right testicle for over a month.  He was treated for epididymitis.  He was seen by a urologist in [place] who found that he had a right inguinal hernia.  He continues to have intermittent swelling of the right testicle.  It is made worse with straining. Past Medical History:  Diagnosis Date   Anxiety    Depression     Past Surgical History:  Procedure Laterality Date   INCISION AND DRAINAGE ABSCESS Right 08/24/2016   Procedure: INCISION AND DRAINAGE ABSCESS;  Surgeon: Suzanna Obey, MD;  Location: [facility] OR;  Service: ENT;  Laterality: Right;  Incision and drainage right Peritonsillar abcess    SHOULDER ARTHROSCOPY     VASECTOMY     wrist shoulder      Family History  Problem Relation Age of Onset   Breast cancer Mother    COPD Father     No current outpatient medications on file prior to visit.   No current facility-administered medications on file prior to visit.    Allergies  Allergen Reactions   Relafen [Nabumetone] Rash    Social History   Substance and Sexual Activity  Alcohol Use Yes   Comment: occasionally    Social History   Tobacco Use  Smoking Status Former   Packs/day: 1.00   Years: 15.00   Pack years: 15.00   Types: Cigarettes   Quit date: 02/28/1998   Years since quitting: 22.6  Smokeless Tobacco Never    Review of Systems  Constitutional: Negative.   HENT: Negative.    Eyes: Negative.   Respiratory: Negative.    Cardiovascular: Negative.   Gastrointestinal: Negative.   Genitourinary: Negative.   Musculoskeletal: Negative.   Skin: Negative.   Neurological: Negative.   Endo/Heme/Allergies: Negative.   Psychiatric/Behavioral: Negative.     Objective   Vitals:   09/29/20 0850  BP: (!) 141/86   Pulse: 83  Resp: 16  Temp: 98.4 F (36.9 C)  SpO2: 96%    Physical Exam Vitals reviewed.  Constitutional:      Appearance: Normal appearance. He is not ill-appearing.  HENT:     Head: Normocephalic and atraumatic.  Cardiovascular:     Rate and Rhythm: Normal rate and regular rhythm.     Heart sounds: Normal heart sounds. No murmur heard.   No friction rub. No gallop.  Pulmonary:     Effort: Pulmonary effort is normal. No respiratory distress.     Breath sounds: Normal breath sounds. No stridor. No wheezing, rhonchi or rales.  Abdominal:     General: Bowel sounds are normal. There is no distension.     Palpations: Abdomen is soft. There is no mass.     Tenderness: There is no abdominal tenderness. There is no guarding or rebound.     Hernia: A hernia is present.     Comments: A small radial hernia which is reducible is noted.  A weak left inguinal floor is also noted.  Genitourinary:    Comments: No testicular swelling is present.  He does seem to have tenderness at the epididymis. Skin:    General: Skin is warm and dry.  Neurological:     Mental Status: He is alert and oriented to person, place, and time.  Ultrasound  of testicles was negative for hydrocele or focal lesions. Primary care notes reviewed Assessment  Right inguinal hernia Plan  Patient is scheduled for right inguinal herniorrhaphy with mesh on 10/05/2020.  The risks and benefits of the procedure including bleeding, infection, mesh use, and the possibly recurrence of the hernia were fully explained to the patient, who gave informed consent.

## 2020-09-29 NOTE — Patient Instructions (Signed)
George [name]  09/29/2020     @PREFPERIOPPHARMACY @   Your procedure is scheduled on  10/05/2020.   Report to 12/05/2020 at  [phone]  A.M.   Call this number if you have problems the morning of surgery:  [phone]   Remember:  Do not eat or drink after midnight.      Take these medicines the morning of surgery with A SIP OF WATER                                                          NONE     Do not wear jewelry, make-up or nail polish.  Do not wear lotions, powders, or perfumes, or deodorant.  Do not shave 48 hours prior to surgery.  Men may shave face and neck.  Do not bring valuables to the hospital.  [facility] is not responsible for any belongings or valuables.  Contacts, dentures or bridgework may not be worn into surgery.  Leave your suitcase in the car.  After surgery it may be brought to your room.  For patients admitted to the hospital, discharge time will be determined by your treatment team.  Patients discharged the day of surgery will not be allowed to drive home and must  have someone with them for 24 hours.    Special instructions:   DO NOT smoke tobacco or vape for 24 hours before your procedure.  Please read over the following fact sheets that you were given. Coughing and Deep Breathing, Surgical Site Infection Prevention, Anesthesia Post-op Instructions, and Care and Recovery After Surgery      Open Hernia Repair, Adult, Care After What can I expect after the procedure? After the procedure, it is common to have: Mild discomfort. Slight bruising. Mild swelling. Pain in the belly (abdomen). A small amount of blood from the cut from surgery (incision). Follow these instructions at home: Your doctor may give you more specific instructions. If you have problems, callyour doctor. Medicines Take over-the-counter and prescription medicines only as told by your doctor. If told, take steps to prevent problems with pooping (constipation).  You may need to: Drink enough fluid to keep your pee (urine) pale yellow. Take medicines. You will be told what medicines to take. Eat foods that are high in fiber. These include beans, whole grains, and fresh fruits and vegetables. Limit foods that are high in fat and sugar. These include fried or sweet foods. Ask your doctor if you should avoid driving or using machines while you are taking your medicine. Incision care  Follow instructions from your doctor about how to take care of your incision. Make sure you: Wash your hands with soap and water for at least 20 seconds before and after you change your bandage (dressing). If you cannot use soap and water, use hand sanitizer. Change your bandage. Leave stitches or skin glue in place for at least 2 weeks. Leave tape strips alone unless you are told to take them off. You may trim the edges of the tape strips if they curl up. Check your incision every day for signs of infection. Check for: More redness, swelling, or pain. More fluid or blood. Warmth. Pus or a bad smell. Wear loose, soft clothing while your incision heals.  Activity  Rest as told by your doctor. Do not lift anything that is heavier than 10 lb (4.5 kg), or the limit that you are told. Do not play contact sports until your doctor says that this is safe. If you were given a sedative during your procedure, do not drive or use machines until your doctor says that it is safe. A sedative is a medicine that helps you relax. Return to your normal activities when your doctor says that it is safe.  General instructions Do not take baths, swim, or use a hot tub. Ask your doctor about taking showers or sponge baths. Hold a pillow over your belly when you cough or sneeze. This helps with pain. Do not smoke or use any products that contain nicotine or tobacco. If you need help quitting, ask your doctor. Keep all follow-up visits. Contact a doctor if: You have any of these signs of  infection in or around your incision: More redness, swelling, or pain. More fluid or blood. Warmth. Pus. A bad smell. You have a fever or chills. You have blood in your poop (stool). You have not pooped (had a bowel movement) in 2-3 days. Medicine does not help your pain. Get help right away if: You have chest pain, or you are short of breath. You feel faint or light-headed. You have very bad pain. You vomit and your pain is worse. You have pain, swelling, or redness in a leg. These symptoms may be an emergency. Get help right away. Call your local emergency services (911 in the [place] Do not wait to see if the symptoms will go away. Do not drive yourself to the hospital. Summary After this procedure, it is common to have mild discomfort, slight bruising, and mild swelling. Follow instructions from your doctor about how to take care of your cut from surgery (incision). Check every day for signs of infection. Do not lift heavy objects or play contact sports until your doctor says it is safe. Return to your normal activities as told by your doctor. This information is not intended to replace advice given to you by your health care provider. Make sure you discuss any questions you have with your healthcare provider. Document Revised: 09/30/2019 Document Reviewed: 09/30/2019 Elsevier Patient Education  2022 [employer] General Anesthesia, Adult, Care After This sheet gives you information about how to care for yourself after your procedure. Your health care provider may also give you more specific instructions. If you have problems or questions, contact your health careprovider. What can I expect after the procedure? After the procedure, the following side effects are common: Pain or discomfort at the IV site. Nausea. Vomiting. Sore throat. Trouble concentrating. Feeling cold or chills. Feeling weak or tired. Sleepiness and fatigue. Soreness and body aches. These side effects  can affect parts of the body that were not involved in surgery. Follow these instructions at home: For the time period you were told by your health care provider:  Rest. Do not participate in activities where you could fall or become injured. Do not drive or use machinery. Do not drink alcohol. Do not take sleeping pills or medicines that cause drowsiness. Do not make important decisions or sign legal documents. Do not take care of children on your own.  Eating and drinking Follow any instructions from your health care provider about eating or drinking restrictions. When you feel hungry, start by eating small amounts of foods that are soft and easy to digest (bland), such as toast. Gradually return  to your regular diet. Drink enough fluid to keep your urine pale yellow. If you vomit, rehydrate by drinking water, juice, or clear broth. General instructions If you have sleep apnea, surgery and certain medicines can increase your risk for breathing problems. Follow instructions from your health care provider about wearing your sleep device: Anytime you are sleeping, including during daytime naps. While taking prescription pain medicines, sleeping medicines, or medicines that make you drowsy. Have a responsible adult stay with you for the time you are told. It is important to have someone help care for you until you are awake and alert. Return to your normal activities as told by your health care provider. Ask your health care provider what activities are safe for you. Take over-the-counter and prescription medicines only as told by your health care provider. If you smoke, do not smoke without supervision. Keep all follow-up visits as told by your health care provider. This is important. Contact a health care provider if: You have nausea or vomiting that does not get better with medicine. You cannot eat or drink without vomiting. You have pain that does not get better with medicine. You are  unable to pass urine. You develop a skin rash. You have a fever. You have redness around your IV site that gets worse. Get help right away if: You have difficulty breathing. You have chest pain. You have blood in your urine or stool, or you vomit blood. Summary After the procedure, it is common to have a sore throat or nausea. It is also common to feel tired. Have a responsible adult stay with you for the time you are told. It is important to have someone help care for you until you are awake and alert. When you feel hungry, start by eating small amounts of foods that are soft and easy to digest (bland), such as toast. Gradually return to your regular diet. Drink enough fluid to keep your urine pale yellow. Return to your normal activities as told by your health care provider. Ask your health care provider what activities are safe for you. This information is not intended to replace advice given to you by your health care provider. Make sure you discuss any questions you have with your healthcare provider. Document Revised: 10/31/2019 Document Reviewed: 05/30/2019 Elsevier Patient Education  2022 [employer] Inc. How to Use Chlorhexidine for Bathing Chlorhexidine gluconate (CHG) is a germ-killing (antiseptic) solution that is used to clean the skin. It can get rid of the bacteria that normally live on the skin and can keep them away for about 24 hours. To clean your skin with CHG, you may be given: A CHG solution to use in the shower or as part of a sponge bath. A prepackaged cloth that contains CHG. Cleaning your skin with CHG may help lower the risk for infection: While you are staying in the intensive care unit of the hospital. If you have a vascular access, such as a central line, to provide short-term or long-term access to your veins. If you have a catheter to drain urine from your bladder. If you are on a ventilator. A ventilator is a machine that helps you breathe by moving air in and  out of your lungs. After surgery. What are the risks? Risks of using CHG include: A skin reaction. Hearing loss, if CHG gets in your ears. Eye injury, if CHG gets in your eyes and is not rinsed out. The CHG product catching fire. Make sure that you avoid smoking and  flames after applying CHG to your skin. Do not use CHG: If you have a chlorhexidine allergy or have previously reacted to chlorhexidine. On babies younger than 6 months of age. How to use CHG solution Use CHG only as told by your health care provider, and follow the instructions on the label. Use the full amount of CHG as directed. Usually, this is one bottle. During a shower Follow these steps when using CHG solution during a shower (unless your health care provider gives you different instructions): Start the shower. Use your normal soap and shampoo to wash your face and hair. Turn off the shower or move out of the shower stream. Pour the CHG onto a clean washcloth. Do not use any type of brush or rough-edged sponge. Starting at your neck, lather your body down to your toes. Make sure you follow these instructions: If you will be having surgery, pay special attention to the part of your body where you will be having surgery. Scrub this area for at least 1 minute. Do not use CHG on your head or face. If the solution gets into your ears or eyes, rinse them well with water. Avoid your genital area. Avoid any areas of skin that have broken skin, cuts, or scrapes. Scrub your back and under your arms. Make sure to wash skin folds. Let the lather sit on your skin for 1-2 minutes or as long as told by your health care provider. Thoroughly rinse your entire body in the shower. Make sure that all body creases and crevices are rinsed well. Dry off with a clean towel. Do not put any substances on your body afterward--such as powder, lotion, or perfume--unless you are told to do so by your health care provider. Only use lotions that are  recommended by the manufacturer. Put on clean clothes or pajamas. If it is the night before your surgery, sleep in clean sheets.  During a sponge bath Follow these steps when using CHG solution during a sponge bath (unless your health care provider gives you different instructions): Use your normal soap and shampoo to wash your face and hair. Pour the CHG onto a clean washcloth. Starting at your neck, lather your body down to your toes. Make sure you follow these instructions: If you will be having surgery, pay special attention to the part of your body where you will be having surgery. Scrub this area for at least 1 minute. Do not use CHG on your head or face. If the solution gets into your ears or eyes, rinse them well with water. Avoid your genital area. Avoid any areas of skin that have broken skin, cuts, or scrapes. Scrub your back and under your arms. Make sure to wash skin folds. Let the lather sit on your skin for 1-2 minutes or as long as told by your health care provider. Using a different clean, wet washcloth, thoroughly rinse your entire body. Make sure that all body creases and crevices are rinsed well. Dry off with a clean towel. Do not put any substances on your body afterward--such as powder, lotion, or perfume--unless you are told to do so by your health care provider. Only use lotions that are recommended by the manufacturer. Put on clean clothes or pajamas. If it is the night before your surgery, sleep in clean sheets. How to use CHG prepackaged cloths Only use CHG cloths as told by your health care provider, and follow the instructions on the label. Use the CHG cloth on clean, dry  skin. Do not use the CHG cloth on your head or face unless your health care provider tells you to. When washing with the CHG cloth: Avoid your genital area. Avoid any areas of skin that have broken skin, cuts, or scrapes. Before surgery Follow these steps when using a CHG cloth to clean before  surgery (unless your health care provider gives you different instructions): Using the CHG cloth, vigorously scrub the part of your body where you will be having surgery. Scrub using a back-and-forth motion for 3 minutes. The area on your body should be completely wet with CHG when you are done scrubbing. Do not rinse. Discard the cloth and let the area air-dry. Do not put any substances on the area afterward, such as powder, lotion, or perfume. Put on clean clothes or pajamas. If it is the night before your surgery, sleep in clean sheets.  For general bathing Follow these steps when using CHG cloths for general bathing (unless your health care provider gives you different instructions). Use a separate CHG cloth for each area of your body. Make sure you wash between any folds of skin and between your fingers and toes. Wash your body in the following order, switching to a new cloth after each step: The front of your neck, shoulders, and chest. Both of your arms, under your arms, and your hands. Your stomach and groin area, avoiding the genitals. Your right leg and foot. Your left leg and foot. The back of your neck, your back, and your buttocks. Do not rinse. Discard the cloth and let the area air-dry. Do not put any substances on your body afterward--such as powder, lotion, or perfume--unless you are told to do so by your health care provider. Only use lotions that are recommended by the manufacturer. Put on clean clothes or pajamas. Contact a health care provider if: Your skin gets irritated after scrubbing. You have questions about using your solution or cloth. Get help right away if: Your eyes become very red or swollen. Your eyes itch badly. Your skin itches badly and is red or swollen. Your hearing changes. You have trouble seeing. You have swelling or tingling in your mouth or throat. You have trouble breathing. You swallow any chlorhexidine. Summary Chlorhexidine gluconate (CHG)  is a germ-killing (antiseptic) solution that is used to clean the skin. Cleaning your skin with CHG may help to lower your risk for infection. You may be given CHG to use for bathing. It may be in a bottle or in a prepackaged cloth to use on your skin. Carefully follow your health care provider's instructions and the instructions on the product label. Do not use CHG if you have a chlorhexidine allergy. Contact your health care provider if your skin gets irritated after scrubbing. This information is not intended to replace advice given to you by your health care provider. Make sure you discuss any questions you have with your healthcare provider. Document Revised: 06/28/2019 Document Reviewed: 08/02/2019 Elsevier Patient Education  2022 [employer].

## 2020-10-02 ENCOUNTER — Encounter (HOSPITAL_COMMUNITY): Payer: Self-pay

## 2020-10-02 ENCOUNTER — Other Ambulatory Visit: Payer: Self-pay

## 2020-10-02 ENCOUNTER — Encounter (HOSPITAL_COMMUNITY)
Admission: RE | Admit: 2020-10-02 | Discharge: 2020-10-02 | Disposition: A | Discharge status: Home / Self Care | Payer: BC Managed Care – PPO | Source: Ambulatory Visit | Attending: General Surgery | Admitting: General Surgery

## 2020-10-02 DIAGNOSIS — Z87891 Personal history of nicotine dependence: Secondary | ICD-10-CM | POA: Diagnosis not present

## 2020-10-02 DIAGNOSIS — Z888 Allergy status to other drugs, medicaments and biological substances status: Secondary | ICD-10-CM | POA: Diagnosis not present

## 2020-10-02 DIAGNOSIS — N5089 Other specified disorders of the male genital organs: Secondary | ICD-10-CM | POA: Diagnosis not present

## 2020-10-02 DIAGNOSIS — Z01812 Encounter for preprocedural laboratory examination: Secondary | ICD-10-CM | POA: Insufficient documentation

## 2020-10-02 DIAGNOSIS — K4090 Unilateral inguinal hernia, without obstruction or gangrene, not specified as recurrent: Secondary | ICD-10-CM | POA: Diagnosis present

## 2020-10-02 HISTORY — DX: Sleep apnea, unspecified: G47.30

## 2020-10-05 ENCOUNTER — Other Ambulatory Visit: Payer: Self-pay

## 2020-10-05 ENCOUNTER — Ambulatory Visit (HOSPITAL_COMMUNITY)
Admission: RE | Admit: 2020-10-05 | Discharge: 2020-10-05 | Disposition: A | Discharge status: Home / Self Care | Payer: BC Managed Care – PPO | Attending: General Surgery | Admitting: General Surgery

## 2020-10-05 ENCOUNTER — Ambulatory Visit (HOSPITAL_COMMUNITY): Payer: BC Managed Care – PPO | Admitting: Anesthesiology

## 2020-10-05 ENCOUNTER — Encounter (HOSPITAL_COMMUNITY)
Admission: RE | Disposition: A | Discharge status: Home / Self Care | Payer: Self-pay | Source: Home / Self Care | Attending: General Surgery

## 2020-10-05 ENCOUNTER — Encounter (HOSPITAL_COMMUNITY): Payer: Self-pay | Admitting: General Surgery

## 2020-10-05 DIAGNOSIS — Z888 Allergy status to other drugs, medicaments and biological substances status: Secondary | ICD-10-CM | POA: Insufficient documentation

## 2020-10-05 DIAGNOSIS — Z87891 Personal history of nicotine dependence: Secondary | ICD-10-CM | POA: Insufficient documentation

## 2020-10-05 DIAGNOSIS — K4090 Unilateral inguinal hernia, without obstruction or gangrene, not specified as recurrent: Secondary | ICD-10-CM | POA: Insufficient documentation

## 2020-10-05 DIAGNOSIS — N5089 Other specified disorders of the male genital organs: Secondary | ICD-10-CM | POA: Insufficient documentation

## 2020-10-05 HISTORY — PX: INGUINAL HERNIA REPAIR: SHX194

## 2020-10-05 SURGERY — REPAIR, HERNIA, INGUINAL, ADULT
Anesthesia: General | Site: Inguinal | Laterality: Right

## 2020-10-05 MED ORDER — SODIUM CHLORIDE 0.9 % IR SOLN
Status: DC | PRN
  Administered 2020-10-05: 1000 mL

## 2020-10-05 MED ORDER — LIDOCAINE HCL (PF) 2 % IJ SOLN
INTRAMUSCULAR | Status: AC
  Filled 2020-10-05: qty 5

## 2020-10-05 MED ORDER — HYDROCODONE-ACETAMINOPHEN 7.5-325 MG PO TABS: 1.0000 | ORAL_TABLET | Freq: Once | ORAL | Status: DC | PRN

## 2020-10-05 MED ORDER — LIDOCAINE HCL (CARDIAC) PF 50 MG/5ML IV SOSY
PREFILLED_SYRINGE | INTRAVENOUS | Status: DC | PRN
  Administered 2020-10-05: 50 mg via INTRAVENOUS

## 2020-10-05 MED ORDER — METOPROLOL TARTRATE 5 MG/5ML IV SOLN
INTRAVENOUS | Status: AC
  Filled 2020-10-05: qty 5

## 2020-10-05 MED ORDER — HYDROMORPHONE HCL 1 MG/ML IJ SOLN: 0.2500 mg | INTRAMUSCULAR | Status: DC | PRN

## 2020-10-05 MED ORDER — BUPIVACAINE HCL (300 MG DOSE) 3 X 100 MG IL IMPL
DRUG_IMPLANT | Status: DC | PRN
  Administered 2020-10-05: 200 mg

## 2020-10-05 MED ORDER — ONDANSETRON HCL 4 MG/2ML IJ SOLN
INTRAMUSCULAR | Status: AC
  Filled 2020-10-05: qty 2

## 2020-10-05 MED ORDER — OXYCODONE-ACETAMINOPHEN 5-325 MG PO TABS: 1.0000 | ORAL_TABLET | ORAL | 0 refills | Status: AC | PRN

## 2020-10-05 MED ORDER — FENTANYL CITRATE (PF) 100 MCG/2ML IJ SOLN
INTRAMUSCULAR | Status: AC
  Filled 2020-10-05: qty 2

## 2020-10-05 MED ORDER — ONDANSETRON HCL 4 MG/2ML IJ SOLN
INTRAMUSCULAR | Status: DC | PRN
  Administered 2020-10-05: 4 mg via INTRAVENOUS

## 2020-10-05 MED ORDER — CHLORHEXIDINE GLUCONATE 0.12 % MT SOLN
15.0000 mL | Freq: Once | OROMUCOSAL | Status: AC
  Administered 2020-10-05: 15 mL via OROMUCOSAL

## 2020-10-05 MED ORDER — CHLORHEXIDINE GLUCONATE CLOTH 2 % EX PADS: 6.0000 | MEDICATED_PAD | Freq: Once | CUTANEOUS | Status: DC

## 2020-10-05 MED ORDER — LACTATED RINGERS IV SOLN: INTRAVENOUS | Status: DC

## 2020-10-05 MED ORDER — MEPERIDINE HCL 50 MG/ML IJ SOLN: 6.2500 mg | INTRAMUSCULAR | Status: DC | PRN

## 2020-10-05 MED ORDER — FENTANYL CITRATE (PF) 100 MCG/2ML IJ SOLN
INTRAMUSCULAR | Status: DC | PRN
  Administered 2020-10-05 (×2): 100 ug via INTRAVENOUS

## 2020-10-05 MED ORDER — ESMOLOL HCL 100 MG/10ML IV SOLN
INTRAVENOUS | Status: AC
  Filled 2020-10-05: qty 10

## 2020-10-05 MED ORDER — ONDANSETRON HCL 4 MG/2ML IJ SOLN
4.0000 mg | Freq: Once | INTRAMUSCULAR | Status: AC | PRN
  Administered 2020-10-05: 4 mg via INTRAVENOUS
  Filled 2020-10-05: qty 2

## 2020-10-05 MED ORDER — PROPOFOL 10 MG/ML IV BOLUS
INTRAVENOUS | Status: AC
  Filled 2020-10-05: qty 40

## 2020-10-05 MED ORDER — PROPOFOL 10 MG/ML IV BOLUS
INTRAVENOUS | Status: DC | PRN
  Administered 2020-10-05: 200 mg via INTRAVENOUS

## 2020-10-05 MED ORDER — KETOROLAC TROMETHAMINE 30 MG/ML IJ SOLN
30.0000 mg | Freq: Once | INTRAMUSCULAR | Status: AC | PRN
  Administered 2020-10-05: 30 mg via INTRAVENOUS
  Filled 2020-10-05: qty 1

## 2020-10-05 MED ORDER — CEFAZOLIN SODIUM-DEXTROSE 2-4 GM/100ML-% IV SOLN
2.0000 g | INTRAVENOUS | Status: AC
  Administered 2020-10-05: 2 g via INTRAVENOUS
  Filled 2020-10-05: qty 100

## 2020-10-05 SURGICAL SUPPLY — 29 items
ADH SKN CLS APL DERMABOND .7 (GAUZE/BANDAGES/DRESSINGS) ×1
CLOTH BEACON ORANGE TIMEOUT ST (SAFETY) ×2 IMPLANT
COVER LIGHT HANDLE STERIS (MISCELLANEOUS) ×4 IMPLANT
DERMABOND ADVANCED (GAUZE/BANDAGES/DRESSINGS) ×1
DERMABOND ADVANCED .7 DNX12 (GAUZE/BANDAGES/DRESSINGS) ×1 IMPLANT
DRAIN PENROSE 0.5X18 (DRAIN) ×2 IMPLANT
ELECT REM PT RETURN 9FT ADLT (ELECTROSURGICAL) ×2
ELECTRODE REM PT RTRN 9FT ADLT (ELECTROSURGICAL) ×1 IMPLANT
GAUZE 4X4 16PLY ~~LOC~~+RFID DBL (SPONGE) ×1 IMPLANT
GAUZE SPONGE 4X4 12PLY STRL (GAUZE/BANDAGES/DRESSINGS) ×2 IMPLANT
GLOVE SURG SS PI 7.5 STRL IVOR (GLOVE) ×2 IMPLANT
GLOVE SURG UNDER POLY LF SZ7 (GLOVE) ×6 IMPLANT
GOWN STRL REUS W/TWL LRG LVL3 (GOWN DISPOSABLE) ×6 IMPLANT
INST SET MINOR GENERAL (KITS) ×2 IMPLANT
KIT TURNOVER KIT A (KITS) ×2 IMPLANT
MESH MARLEX PLUG MEDIUM (Mesh General) ×1 IMPLANT
NS IRRIG 1000ML POUR BTL (IV SOLUTION) ×2 IMPLANT
PACK MINOR (CUSTOM PROCEDURE TRAY) ×2 IMPLANT
PAD ARMBOARD 7.5X6 YLW CONV (MISCELLANEOUS) ×2 IMPLANT
PENCIL SMOKE EVACUATOR (MISCELLANEOUS) ×2 IMPLANT
SET BASIN LINEN APH (SET/KITS/TRAYS/PACK) ×2 IMPLANT
SOL PREP PROV IODINE SCRUB 4OZ (MISCELLANEOUS) ×2 IMPLANT
SUT MNCRL AB 4-0 PS2 18 (SUTURE) ×2 IMPLANT
SUT NOVA NAB GS-22 2 2-0 T-19 (SUTURE) ×5 IMPLANT
SUT VIC AB 2-0 CT1 27 (SUTURE) ×2
SUT VIC AB 2-0 CT1 TAPERPNT 27 (SUTURE) ×1 IMPLANT
SUT VIC AB 3-0 SH 27 (SUTURE) ×2
SUT VIC AB 3-0 SH 27X BRD (SUTURE) ×1 IMPLANT
SUT VICRYL AB 2 0 TIES (SUTURE) ×1 IMPLANT

## 2020-10-05 NOTE — Interval H&P Note (Signed)
History and Physical Interval Note:  10/05/2020 7:20 AM  George [name]  has presented today for surgery, with the diagnosis of Right Inguinal hernia.  The various methods of treatment have been discussed with the patient and family. After consideration of risks, benefits and other options for treatment, the patient has consented to  Procedure(s): HERNIA REPAIR INGUINAL ADULT (Right) as a surgical intervention.  The patient's history has been reviewed, patient examined, no change in status, stable for surgery.  I have reviewed the patient's chart and labs.  Questions were answered to the patient's satisfaction.     Franky Macho

## 2020-10-05 NOTE — Anesthesia Preprocedure Evaluation (Signed)
Anesthesia Evaluation  Patient identified by MRN, date of birth, ID band Patient awake    Airway Mallampati: II  TM Distance: >3 FB Neck ROM: Full    Dental no notable dental hx.    Pulmonary sleep apnea , former smoker,    Pulmonary exam normal breath sounds clear to auscultation       Cardiovascular negative cardio ROS Normal cardiovascular exam Rhythm:Regular Rate:Normal     Neuro/Psych Anxiety Depression negative neurological ROS     GI/Hepatic negative GI ROS, Neg liver ROS,   Endo/Other  negative endocrine ROS  Renal/GU negative Renal ROS  negative genitourinary   Musculoskeletal negative musculoskeletal ROS (+)   Abdominal   Peds  Hematology negative hematology ROS (+)   Anesthesia Other Findings   Reproductive/Obstetrics negative OB ROS                             Anesthesia Physical Anesthesia Plan  ASA: 2  Anesthesia Plan: General   Post-op Pain Management:    Induction: Intravenous  PONV Risk Score and Plan:   Airway Management Planned: LMA  Additional Equipment:   Intra-op Plan:   Post-operative Plan:   Informed Consent: I have reviewed the patients History and Physical, chart, labs and discussed the procedure including the risks, benefits and alternatives for the proposed anesthesia with the patient or authorized representative who has indicated his/her understanding and acceptance.       Plan Discussed with:   Anesthesia Plan Comments:         Anesthesia Quick Evaluation

## 2020-10-05 NOTE — Anesthesia Procedure Notes (Signed)
Procedure Name: LMA Insertion Date/Time: 10/05/2020 7:37 AM Performed by: Franco Nones, CRNA Pre-anesthesia Checklist: Patient identified, Patient being monitored, Emergency Drugs available, Timeout performed and Suction available Patient Re-evaluated:Patient Re-evaluated prior to induction Oxygen Delivery Method: Circle System Utilized Preoxygenation: Pre-oxygenation with 100% oxygen Induction Type: IV induction Ventilation: Mask ventilation without difficulty LMA: LMA inserted LMA Size: 4.0 Number of attempts: 1 Placement Confirmation: positive ETCO2 and breath sounds checked- equal and bilateral Tube secured with: Tape Dental Injury: Teeth and Oropharynx as per pre-operative assessment

## 2020-10-05 NOTE — Op Note (Signed)
Patient:  George [name]  DOB:  January 04, 1966  MRN:  [id number]   Preop Diagnosis: Right inguinal hernia  Postop Diagnosis: Same  Procedure: Right inguinal herniorrhaphy with mesh  Surgeon: Franky Macho, MD  Anes: General  Indications: Patient is a 55 year old white male who presents with a symptomatic right inguinal hernia.  The risks and benefits of the procedure including bleeding, infection, mesh use, and the possibility of recurrence of the hernia were fully explained to the patient, he gave informed consent.  Procedure note: The patient was placed in the supine position.  After general anesthesia was administered, the right groin region was prepped and draped using the usual sterile technique with Betadine.  Surgical site confirmation was performed.  Incision was made in the right groin region down to the external oblique aponeurosis.  The aponeurosis was incised to the external ring.  The Penrose drain was placed around the spermatic cord.  The vas deferens was noted within the spermatic cord.  The ilioinguinal nerve was identified and avoided.  An indirect hernia with adipose tissue was found.  A high ligation was performed using a 2-0 Vicryl tie and the hernia inverted.  A medium size Bard PerFix plug was then placed.  An onlay patch was placed along the floor of the inguinal canal and secured superiorly to the conjoined tendon and inferiorly to the shelving edge of Poupart's ligament using 2-0 Novafil interrupted sutures.  The internal ring was recreated using a 2-0 Novafil interrupted suture.  [name] was placed over the mesh into the subcutaneous tissue.  The external oblique aponeurosis was reapproximated using a 2-0 Vicryl running suture.  Subcutaneous layer was reapproximated using 3-0 Vicryl interrupted sutures.  The skin was closed using a 4-0 Monocryl subcuticular suture.  Dermabond was applied.  All tape and needle counts were correct at the end of the procedure.  The patient  was awakened and transferred to PACU in stable condition.  Complications: None  EBL: Minimal  Specimen: None

## 2020-10-05 NOTE — Transfer of Care (Signed)
Immediate Anesthesia Transfer of Care Note  Patient: George [name]  Procedure(s) Performed: HERNIA REPAIR INGUINAL ADULT (Right: Inguinal)  Patient Location: PACU  Anesthesia Type:General  Level of Consciousness: awake and patient cooperative  Airway & Oxygen Therapy: Patient Spontanous Breathing  Post-op Assessment: Report given to RN and Post -op Vital signs reviewed and stable  Post vital signs: Reviewed and stable  Last Vitals:  Vitals Value Taken Time  BP 120/91 10/05/20 0834  Temp    Pulse 89 10/05/20 0835  Resp    SpO2 92 % 10/05/20 0835  Vitals shown include unvalidated device data.  Last Pain:  Vitals:   10/05/20 0644  TempSrc: Oral  PainSc: 2       Patients Stated Pain Goal: 5 (10/05/20 4010)  Complications: No notable events documented.

## 2020-10-06 ENCOUNTER — Encounter (HOSPITAL_COMMUNITY): Payer: Self-pay | Admitting: General Surgery

## 2020-10-06 NOTE — Anesthesia Postprocedure Evaluation (Signed)
Anesthesia Post Note  Patient: George [name]  Procedure(s) Performed: HERNIA REPAIR INGUINAL ADULT (Right: Inguinal)  Patient location during evaluation: PACU Anesthesia Type: General Level of consciousness: awake and alert Pain management: pain level controlled Vital Signs Assessment: post-procedure vital signs reviewed and stable Respiratory status: spontaneous breathing, nonlabored ventilation, respiratory function stable and patient connected to nasal cannula oxygen Cardiovascular status: blood pressure returned to baseline and stable Postop Assessment: no apparent nausea or vomiting Anesthetic complications: no   No notable events documented.   Last Vitals:  Vitals:   10/05/20 0845 10/05/20 0908  BP: 113/80 93/60  Pulse: 83 61  Resp: 14 16  Temp:  36.5 C  SpO2: 94% 98%    Last Pain:  Vitals:   10/06/20 1023  TempSrc:   PainSc: 2                  Shona Needles

## 2020-10-07 ENCOUNTER — Other Ambulatory Visit: Payer: Self-pay

## 2020-10-07 ENCOUNTER — Emergency Department (HOSPITAL_COMMUNITY)
Admission: EM | Admit: 2020-10-07 | Discharge: 2020-10-08 | Disposition: A | Discharge status: Home / Self Care | Payer: BC Managed Care – PPO | Attending: Emergency Medicine | Admitting: Emergency Medicine

## 2020-10-07 ENCOUNTER — Encounter (HOSPITAL_COMMUNITY): Payer: Self-pay | Admitting: Emergency Medicine

## 2020-10-07 DIAGNOSIS — I10 Essential (primary) hypertension: Secondary | ICD-10-CM | POA: Insufficient documentation

## 2020-10-07 DIAGNOSIS — I160 Hypertensive urgency: Secondary | ICD-10-CM

## 2020-10-07 DIAGNOSIS — F419 Anxiety disorder, unspecified: Secondary | ICD-10-CM | POA: Insufficient documentation

## 2020-10-07 DIAGNOSIS — Z87891 Personal history of nicotine dependence: Secondary | ICD-10-CM | POA: Insufficient documentation

## 2020-10-07 MED ORDER — LORAZEPAM 2 MG/ML IJ SOLN
1.0000 mg | Freq: Once | INTRAMUSCULAR | Status: AC
  Administered 2020-10-07: 1 mg via INTRAVENOUS
  Filled 2020-10-07: qty 1

## 2020-10-07 MED ORDER — LORAZEPAM 2 MG/ML IJ SOLN: 1.0000 mg | Freq: Once | INTRAMUSCULAR | Status: DC

## 2020-10-07 NOTE — ED Triage Notes (Signed)
Pt c/o feeling dizzy and seeing black spots; pt reports he feels like his BP is elevated; reports elevated BP intermittently

## 2020-10-07 NOTE — ED Provider Notes (Signed)
[facility] EMERGENCY DEPARTMENT Provider Note   CSN: [id number] Arrival date & time: 10/07/20  2230     History Chief Complaint  Patient presents with   Hypertension    George [name] is a 55 y.o. male.  Patient is a 55 year old male with past medical history of sleep apnea and anxiety.  Patient also recently underwent right inguinal hernia repair this past Monday.  He presents today with complaints of elevated blood pressure and feeling anxious.  This started at approximately 830 this evening after taking Percocet for his pain.  He denies to me that he is having any chest pain or shortness of breath, but does report an "unsettled feeling".  He denies fevers or chills.  He denies abdominal complaints other than the postsurgical discomfort.  Blood pressure at home was 170/100.  This was concerning to him and presents for evaluation of this.  The history is provided by the patient.  Hypertension This is a new problem. The problem occurs constantly. The problem has not changed since onset.Pertinent negatives include no chest pain and no shortness of breath. Nothing aggravates the symptoms. He has tried nothing for the symptoms.      Past Medical History:  Diagnosis Date   Anxiety    Depression    Sleep apnea    mild no CPAP needed at that time    Patient Active Problem List   Diagnosis Date Noted   Right inguinal hernia     Past Surgical History:  Procedure Laterality Date   INCISION AND DRAINAGE ABSCESS Right 08/24/2016   Procedure: INCISION AND DRAINAGE ABSCESS;  Surgeon: Suzanna Obey, MD;  Location: [facility] OR;  Service: ENT;  Laterality: Right;  Incision and drainage right Peritonsillar abcess    INGUINAL HERNIA REPAIR Right 10/05/2020   Procedure: HERNIA REPAIR INGUINAL ADULT;  Surgeon: Franky Macho, MD;  Location: [facility] ORS;  Service: General;  Laterality: Right;   SHOULDER ARTHROSCOPY     VASECTOMY     wrist shoulder         Family History  Problem Relation Age of  Onset   Breast cancer Mother    COPD Father     Social History   Tobacco Use   Smoking status: Former    Packs/day: 1.00    Years: 15.00    Pack years: 15.00    Types: Cigarettes    Quit date: 02/28/1998    Years since quitting: 22.6   Smokeless tobacco: Never  Vaping Use   Vaping Use: Never used  Substance Use Topics   Alcohol use: Yes    Alcohol/week: 6.0 standard drinks    Types: 6 Cans of beer per week    Comment: 6/day   Drug use: No    Home Medications Prior to Admission medications   Medication Sig Start Date End Date Taking? Authorizing Provider  acetaminophen (TYLENOL) 325 MG tablet Take 650 mg by mouth every 6 (six) hours as needed for moderate pain.    [provider]  ibuprofen (ADVIL) 200 MG tablet Take 800 mg by mouth every 8 (eight) hours as needed for moderate pain.    [provider]  oxyCODONE-acetaminophen (PERCOCET) 5-325 MG tablet Take 1 tablet by mouth every 4 (four) hours as needed for severe pain. 10/05/20 10/05/21  Franky Macho, MD    Allergies    Relafen [nabumetone]  Review of Systems   Review of Systems  Respiratory:  Negative for shortness of breath.   Cardiovascular:  Negative for chest  pain.  All other systems reviewed and are negative.  Physical Exam Updated Vital Signs BP (!) 152/110 (BP Location: Right Arm)   Pulse (!) 102   Temp 98.3 F (36.8 C) (Oral)   Resp 20   Ht 5\' 8"  (1.727 m)   Wt 89.8 kg   SpO2 100%   BMI 30.11 kg/m   Physical Exam Vitals and nursing note reviewed.  Constitutional:      General: He is not in acute distress.    Appearance: He is well-developed. He is not diaphoretic.  HENT:     Head: Normocephalic and atraumatic.  Cardiovascular:     Rate and Rhythm: Normal rate and regular rhythm.     Heart sounds: No murmur heard.   No friction rub.  Pulmonary:     Effort: Pulmonary effort is normal. No respiratory distress.     Breath sounds: Normal breath sounds. No wheezing or rales.   Abdominal:     General: Bowel sounds are normal. There is no distension.     Palpations: Abdomen is soft.     Tenderness: There is no abdominal tenderness.     Comments: The incision in the right inguinal region appears clean and intact.  There is no dehiscence, erythema, or purulent drainage.  Musculoskeletal:        General: Normal range of motion.     Cervical back: Normal range of motion and neck supple.  Skin:    General: Skin is warm and dry.  Neurological:     Mental Status: He is alert and oriented to person, place, and time.     Coordination: Coordination normal.    ED Results / Procedures / Treatments   Labs (all labs ordered are listed, but only abnormal results are displayed) Labs Reviewed  COMPREHENSIVE METABOLIC PANEL  CBC WITH DIFFERENTIAL/PLATELET  TROPONIN I (HIGH SENSITIVITY)    EKG None  Radiology No results found.  Procedures Procedures   Medications Ordered in ED Medications  LORazepam (ATIVAN) injection 1 mg (has no administration in time range)  LORazepam (ATIVAN) injection 1 mg (has no administration in time range)    ED Course  I have reviewed the triage vital signs and the nursing notes.  Pertinent labs & imaging results that were available during my care of the patient were reviewed by me and considered in my medical decision making (see chart for details).    MDM Rules/Calculators/A&P  Patient presenting here with concerns over elevated blood pressure.  He has felt dizzy and generally unwell.  He also reports feeling anxious.  Patient's blood pressure here is mildly elevated, but I doubt the cause of his symptoms.  Patient does report stress and was given some Ativan with some relief.  His work-up shows no cardiac etiology and laboratory studies are essentially unremarkable.  Patient's blood pressure has trended downward and is now 138/86.  Patient seems appropriate for discharge with outpatient follow-up.  Final Clinical  Impression(s) / ED Diagnoses Final diagnoses:  None    Rx / DC Orders ED Discharge Orders     None        , MD 10/09/20 [phone]

## 2020-10-08 LAB — CBC WITH DIFFERENTIAL/PLATELET
Abs Immature Granulocytes: 0.05 10*3/uL (ref 0.00–0.07)
Basophils Absolute: 0.1 10*3/uL (ref 0.0–0.1)
Basophils Relative: 1 %
Eosinophils Absolute: 0.1 10*3/uL (ref 0.0–0.5)
Eosinophils Relative: 1 %
HCT: 45.8 % (ref 39.0–52.0)
Hemoglobin: 16.6 g/dL (ref 13.0–17.0)
Immature Granulocytes: 1 %
Lymphocytes Relative: 20 %
Lymphs Abs: 2 10*3/uL (ref 0.7–4.0)
MCH: 34.3 pg — ABNORMAL HIGH (ref 26.0–34.0)
MCHC: 36.2 g/dL — ABNORMAL HIGH (ref 30.0–36.0)
MCV: 94.6 fL (ref 80.0–100.0)
Monocytes Absolute: 1.2 10*3/uL — ABNORMAL HIGH (ref 0.1–1.0)
Monocytes Relative: 12 %
Neutro Abs: 6.4 10*3/uL (ref 1.7–7.7)
Neutrophils Relative %: 65 %
Platelets: 174 10*3/uL (ref 150–400)
RBC: 4.84 MIL/uL (ref 4.22–5.81)
RDW: 11.7 % (ref 11.5–15.5)
WBC: 9.9 10*3/uL (ref 4.0–10.5)
nRBC: 0 % (ref 0.0–0.2)

## 2020-10-08 LAB — COMPREHENSIVE METABOLIC PANEL
ALT: 87 U/L — ABNORMAL HIGH (ref 0–44)
AST: 58 U/L — ABNORMAL HIGH (ref 15–41)
Albumin: 4.2 g/dL (ref 3.5–5.0)
Alkaline Phosphatase: 69 U/L (ref 38–126)
Anion gap: 7 (ref 5–15)
BUN: 14 mg/dL (ref 6–20)
CO2: 28 mmol/L (ref 22–32)
Calcium: 9.6 mg/dL (ref 8.9–10.3)
Chloride: 101 mmol/L (ref 98–111)
Creatinine, Ser: 1.15 mg/dL (ref 0.61–1.24)
GFR, Estimated: >60 mL/min (ref >60)
Glucose, Bld: 108 mg/dL — ABNORMAL HIGH (ref 70–99)
Potassium: 3.4 mmol/L — ABNORMAL LOW (ref 3.5–5.1)
Sodium: 136 mmol/L (ref 135–145)
Total Bilirubin: 1.1 mg/dL (ref 0.3–1.2)
Total Protein: 8.2 g/dL — ABNORMAL HIGH (ref 6.5–8.1)

## 2020-10-08 LAB — TROPONIN I (HIGH SENSITIVITY)
Troponin I (High Sensitivity): 4 ng/L (ref <18)
Troponin I (High Sensitivity): 4 ng/L (ref <18)

## 2020-10-08 NOTE — Discharge Instructions (Addendum)
Keep a record of your blood pressures and take this with you to your next doctor's appointment.  Return to the Emergency Department if symptoms worsen or change.

## 2020-10-15 ENCOUNTER — Other Ambulatory Visit: Payer: Self-pay

## 2020-10-15 ENCOUNTER — Encounter: Payer: Self-pay | Admitting: General Surgery

## 2020-10-15 ENCOUNTER — Ambulatory Visit (INDEPENDENT_AMBULATORY_CARE_PROVIDER_SITE_OTHER): Payer: BC Managed Care – PPO | Admitting: General Surgery

## 2020-10-15 VITALS — BP 130/87 | HR 95 | Temp 98.5°F | Resp 16 | Ht 68.0 in | Wt 190.0 lb

## 2020-10-15 DIAGNOSIS — Z09 Encounter for follow-up examination after completed treatment for conditions other than malignant neoplasm: Secondary | ICD-10-CM

## 2020-10-15 NOTE — Progress Notes (Signed)
Subjective:     George [name]  Here for postoperative visit, status post right inguinal herniorrhaphy with mesh.  Patient states he is doing well.  He has stopped his narcotic pain medication.  He is using ibuprofen for pain control.  His right testicular pain is easing. Objective:    BP 130/87   [name] 95   Temp 98.5 F (36.9 C) (Other (Comment))   Resp 16   Ht 5\' 8"  (1.727 m)   Wt 190 lb (86.2 kg)   SpO2 97%   BMI 28.89 kg/m   General:  alert, cooperative, and no distress  Right inguinal incision healing well.  No swelling present.  No scrotal swelling apparent.     Assessment:    Doing well postoperatively.    Plan:   Increase activity as able.  Follow-up here as needed.  Avoid any heavy lifting over 20 pounds for the next few weeks.

## 2021-09-09 ENCOUNTER — Other Ambulatory Visit: Payer: Self-pay | Admitting: Orthopedic Surgery

## 2021-09-09 DIAGNOSIS — M5412 Radiculopathy, cervical region: Secondary | ICD-10-CM

## 2021-09-19 ENCOUNTER — Ambulatory Visit
Admission: RE | Admit: 2021-09-19 | Discharge: 2021-09-19 | Disposition: A | Discharge status: Home / Self Care | Payer: BC Managed Care – PPO | Source: Ambulatory Visit | Attending: Orthopedic Surgery | Admitting: Orthopedic Surgery

## 2021-09-19 DIAGNOSIS — M5412 Radiculopathy, cervical region: Secondary | ICD-10-CM
